# Patient Record
Sex: Female | Born: 1935 | Race: White | Hispanic: No | Marital: Married | Smoking: Never smoker
Health system: Southern US, Community
[De-identification: ages and names within clinical notes are randomized; demographics above are authoritative.]

## PROBLEM LIST (undated history)

## (undated) DIAGNOSIS — C801 Malignant (primary) neoplasm, unspecified: Secondary | ICD-10-CM

## (undated) DIAGNOSIS — R112 Nausea with vomiting, unspecified: Secondary | ICD-10-CM

## (undated) DIAGNOSIS — E039 Hypothyroidism, unspecified: Secondary | ICD-10-CM

## (undated) DIAGNOSIS — E119 Type 2 diabetes mellitus without complications: Secondary | ICD-10-CM

## (undated) DIAGNOSIS — I10 Essential (primary) hypertension: Secondary | ICD-10-CM

## (undated) DIAGNOSIS — R011 Cardiac murmur, unspecified: Secondary | ICD-10-CM

## (undated) HISTORY — PX: CATARACT EXTRACTION: SUR2

## (undated) HISTORY — PX: BACK SURGERY: SHX140

## (undated) HISTORY — DX: Essential (primary) hypertension: I10

## (undated) HISTORY — DX: Type 2 diabetes mellitus without complications: E11.9

---

## 1980-11-24 HISTORY — PX: ANKLE FRACTURE SURGERY: SHX122

## 1999-09-02 ENCOUNTER — Other Ambulatory Visit: Admission: RE | Admit: 1999-09-02 | Discharge: 1999-09-02 | Payer: Self-pay | Admitting: Family Medicine

## 2000-01-30 ENCOUNTER — Encounter: Admission: RE | Admit: 2000-01-30 | Discharge: 2000-01-30 | Payer: Self-pay | Admitting: Internal Medicine

## 2000-01-30 ENCOUNTER — Encounter: Payer: Self-pay | Admitting: Internal Medicine

## 2000-03-12 ENCOUNTER — Observation Stay (HOSPITAL_COMMUNITY): Admission: RE | Admit: 2000-03-12 | Discharge: 2000-03-13 | Payer: Self-pay | Admitting: General Surgery

## 2000-03-12 ENCOUNTER — Encounter (INDEPENDENT_AMBULATORY_CARE_PROVIDER_SITE_OTHER): Payer: Self-pay | Admitting: Specialist

## 2000-10-12 ENCOUNTER — Encounter: Payer: Self-pay | Admitting: Internal Medicine

## 2000-10-12 ENCOUNTER — Encounter: Admission: RE | Admit: 2000-10-12 | Discharge: 2000-10-12 | Payer: Self-pay | Admitting: Internal Medicine

## 2002-09-02 ENCOUNTER — Ambulatory Visit (HOSPITAL_COMMUNITY): Admission: RE | Admit: 2002-09-02 | Discharge: 2002-09-02 | Payer: Self-pay | Admitting: *Deleted

## 2003-04-18 ENCOUNTER — Encounter: Payer: Self-pay | Admitting: Orthopedic Surgery

## 2003-04-20 ENCOUNTER — Encounter: Payer: Self-pay | Admitting: Orthopedic Surgery

## 2003-04-21 ENCOUNTER — Inpatient Hospital Stay (HOSPITAL_COMMUNITY): Admission: RE | Admit: 2003-04-21 | Discharge: 2003-04-22 | Payer: Self-pay | Admitting: Orthopedic Surgery

## 2004-04-11 ENCOUNTER — Encounter: Admission: RE | Admit: 2004-04-11 | Discharge: 2004-04-11 | Payer: Self-pay | Admitting: Internal Medicine

## 2007-05-17 ENCOUNTER — Encounter: Admission: RE | Admit: 2007-05-17 | Discharge: 2007-05-17 | Payer: Self-pay | Admitting: Internal Medicine

## 2008-01-19 ENCOUNTER — Ambulatory Visit (HOSPITAL_COMMUNITY): Admission: RE | Admit: 2008-01-19 | Discharge: 2008-01-20 | Payer: Self-pay | Admitting: Orthopedic Surgery

## 2009-02-19 ENCOUNTER — Encounter: Admission: RE | Admit: 2009-02-19 | Discharge: 2009-02-19 | Payer: Self-pay | Admitting: Internal Medicine

## 2009-02-23 ENCOUNTER — Encounter: Admission: RE | Admit: 2009-02-23 | Discharge: 2009-02-23 | Payer: Self-pay | Admitting: Internal Medicine

## 2011-04-08 NOTE — Op Note (Signed)
Amber [name], Amber [name]                 ACCOUNT NO.:  [id number]   MEDICAL RECORD NO.:  [id number]          PATIENT TYPE:  OIB   LOCATION:  0098                         FACILITY:  [facility]   PHYSICIAN:  Georges Lynch. Gioffre, M.D.DATE OF BIRTH:  1933/12/11   DATE OF PROCEDURE:  01/19/2008  DATE OF DISCHARGE:                               OPERATIVE REPORT   SURGEON:  Georges Lynch. Darrelyn Hillock, M.D.   ASSISTANT:  Jene Every, M.D.   PREOPERATIVE DIAGNOSES:  1. Severe lateral recess stenosis.  2. 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   POSTOPERATIVE DIAGNOSES:  1. Severe lateral recess stenosis.  2. 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   OPERATION:  1. Decompression at L5-S1 on the right for severe spinal stenosis.  2. Microdiskectomy for RECURRENT herniated disk at L5-S1 on the right.   PROCEDURE:  Under general anesthesia routine orthopedic prepping and  draping was carried out with the patient on a spinal frame.  The patient  had 1 gram of IV Ancef preop.  At this time 2 needles were placed in  back for localization purposes, and an x-ray was taken.  Following that,  an incision was made through the old incision site directly over L5-S1.  Bleeders were identified and cauterized.  I then separated the muscle  from the lamina and spinous process at L5-S1 on the right.  The self-  retaining retractors were inserted.  Following that we went down and  easily identified the L5-S1 interspace.   She had a marked amount of scar that I removed first from the dura, and  then we went out and did a nice foraminotomy for the S1 root on the  right.  We had to go far out on the lateral recess, decompressed the  lateral recess because of the severe scarring and overgrowth of bone.  Following that, we identified the S1 root.  We gently dissected the root  free, and then gently retracted the root medially, and went down and  noted large fragments of recurrent disk.  He had  several large fragments  that we removed.  Following that the root now was free.  The foramina  was wide open, and there were no other abnormalities.  The root, now,  was freely movable as well as the dura.  We thoroughly irrigated out the  area, loosely applied some thrombin-soaked Gelfoam.  Following that, the  wound was closed in layers in the usual fashion.  I did leave a small  section of the deep distal part of the wound open for drainage purposes.  Subcu was closed with #0 Vicryl, skin with metal staples, and a sterile  Neosporin dressing was applied.          ______________________________  Georges Lynch Darrelyn Hillock, M.D.    RAG/MEDQ  D:  01/19/2008  T:  01/20/2008  Job:  [id number]

## 2011-04-11 NOTE — Op Note (Signed)
   NAME:  Amber [name], Amber [name]                           ACCOUNT NO.:  [id number]   MEDICAL RECORD NO.:  [id number]                   PATIENT TYPE:  AMB   LOCATION:  DAY                                  FACILITY:  [facility]   PHYSICIAN:  Ronald [name]. Darrelyn Hillock, M.D.             DATE OF BIRTH:  08/06/1934   DATE OF PROCEDURE:  04/20/2003  DATE OF DISCHARGE:                                 OPERATIVE REPORT   SURGEON:  Georges Lynch. Darrelyn Hillock, M.D.   ASSISTANT:  Ronnell Guadalajara, M.D.   PREOPERATIVE DIAGNOSIS:  Recurrent herniated lumbar disk at L5-S1 on the  right.   POSTOPERATIVE DIAGNOSIS:  Recurrent herniated lumbar disk at L5-S1 on the  right.   OPERATION/PROCEDURE:  Hemilaminectomy and microdiskectomy at L5-S1 on the  right for recurrent herniated disk.   DESCRIPTION OF PROCEDURE:  Under general anesthesia, she was placed on the  spinal frame and given Versed and 1 g IV Ancef.  Two needles were placed in  the back for localizing purposes.  X-ray was taken to verify the L5-S1  interspace.  At this time decision was made to open the L5-S1 interspace.  Bleeders were identified and cauterized.  Self-retaining retractors were  inserted.  The incision was carried down to the lamina and the muscle was  separated from the lamina with the Cobb elevators and then with the curets.  Once I identified the lamina of L5-S1 region, I then did [name] hemilaminectomy  in the usual fashion, identified the scar tissue, removed that.  We  cauterized the lateral recessed veins with the bipolar.  Following that, I  went down and identified the dura and the nerve root and then noticed  several large fragments of disk material that were extruded out into the  canal and the lateral recess.  These were gently removed with the nerve  hook.  We then went down into the disk space after making [name] cruciate  incision in the disk space and then completed the diskectomy.  We thoroughly  traced the root out through the foramen and did [name]  foraminotomy as well.  There were no other gross abnormalities noted.  We thoroughly irrigated out  the wound.  I then loosely applied some thrombin-soaked Gelfoam and closed  the wound in layers in the usual fashion.  Sterile Neosporin dressings were  applied.                                                Ronald [name]. Darrelyn Hillock, M.D.    RAG/MEDQ  D:  04/20/2003  T:  04/20/2003  Job:  [id number]

## 2011-08-15 LAB — DIFFERENTIAL
Basophils Relative: 1
Lymphs Abs: 1.4
Monocytes Absolute: 0.6
Monocytes Relative: 6
Neutro Abs: 7.9 — ABNORMAL HIGH
Neutrophils Relative %: 78 — ABNORMAL HIGH

## 2011-08-15 LAB — CBC
HCT: 43.5
MCHC: 35.2
Platelets: 308
RDW: 12.7

## 2011-08-15 LAB — COMPREHENSIVE METABOLIC PANEL
Albumin: 3.8
Alkaline Phosphatase: 76
BUN: 19
Calcium: 9.3
Glucose, Bld: 172 — ABNORMAL HIGH
Potassium: 3.9
Sodium: 133 — ABNORMAL LOW
Total Protein: 6.9

## 2011-08-15 LAB — APTT: aPTT: 29

## 2011-08-15 LAB — PROTIME-INR: INR: 0.9

## 2014-11-22 ENCOUNTER — Other Ambulatory Visit: Payer: Self-pay | Admitting: Internal Medicine

## 2014-11-22 DIAGNOSIS — R51 Headache: Principal | ICD-10-CM

## 2014-11-22 DIAGNOSIS — H538 Other visual disturbances: Secondary | ICD-10-CM

## 2014-11-22 DIAGNOSIS — R519 Headache, unspecified: Secondary | ICD-10-CM

## 2014-12-01 ENCOUNTER — Other Ambulatory Visit: Payer: Self-pay

## 2014-12-22 ENCOUNTER — Other Ambulatory Visit: Payer: Self-pay

## 2014-12-25 ENCOUNTER — Other Ambulatory Visit: Payer: Self-pay

## 2015-01-02 ENCOUNTER — Other Ambulatory Visit: Payer: Self-pay | Admitting: Internal Medicine

## 2015-01-02 ENCOUNTER — Ambulatory Visit
Admission: RE | Admit: 2015-01-02 | Discharge: 2015-01-02 | Disposition: A | Discharge status: Home / Self Care | Payer: Medicare HMO | Source: Ambulatory Visit | Attending: Internal Medicine | Admitting: Internal Medicine

## 2015-01-02 DIAGNOSIS — R059 Cough, unspecified: Secondary | ICD-10-CM

## 2015-01-02 DIAGNOSIS — R05 Cough: Secondary | ICD-10-CM

## 2015-01-05 ENCOUNTER — Ambulatory Visit
Admission: RE | Admit: 2015-01-05 | Discharge: 2015-01-05 | Disposition: A | Discharge status: Home / Self Care | Payer: Medicare HMO | Source: Ambulatory Visit | Attending: Internal Medicine | Admitting: Internal Medicine

## 2015-01-05 DIAGNOSIS — R519 Headache, unspecified: Secondary | ICD-10-CM

## 2015-01-05 DIAGNOSIS — H538 Other visual disturbances: Secondary | ICD-10-CM

## 2015-01-05 DIAGNOSIS — R51 Headache: Principal | ICD-10-CM

## 2015-01-05 MED ORDER — GADOBENATE DIMEGLUMINE 529 MG/ML IV SOLN
14.0000 mL | Freq: Once | INTRAVENOUS | Status: AC | PRN
  Administered 2015-01-05: 14 mL via INTRAVENOUS

## 2018-04-01 DIAGNOSIS — M79644 Pain in right finger(s): Secondary | ICD-10-CM | POA: Insufficient documentation

## 2018-04-01 DIAGNOSIS — M1811 Unilateral primary osteoarthritis of first carpometacarpal joint, right hand: Secondary | ICD-10-CM | POA: Insufficient documentation

## 2019-02-09 ENCOUNTER — Ambulatory Visit
Admission: RE | Admit: 2019-02-09 | Discharge: 2019-02-09 | Disposition: A | Discharge status: Home / Self Care | Payer: Medicare HMO | Source: Ambulatory Visit | Attending: Internal Medicine | Admitting: Internal Medicine

## 2019-02-09 ENCOUNTER — Other Ambulatory Visit: Payer: Self-pay | Admitting: Internal Medicine

## 2019-02-09 DIAGNOSIS — M25571 Pain in right ankle and joints of right foot: Secondary | ICD-10-CM

## 2019-02-11 ENCOUNTER — Other Ambulatory Visit: Payer: Self-pay

## 2019-02-11 ENCOUNTER — Encounter: Payer: Self-pay | Admitting: Sports Medicine

## 2019-02-11 ENCOUNTER — Telehealth: Payer: Self-pay | Admitting: *Deleted

## 2019-02-11 ENCOUNTER — Ambulatory Visit: Payer: Medicare HMO | Admitting: Sports Medicine

## 2019-02-11 VITALS — BP 133/74 | HR 78 | Temp 98.0°F | Resp 16 | Ht 63.0 in | Wt 142.0 lb

## 2019-02-11 DIAGNOSIS — M19071 Primary osteoarthritis, right ankle and foot: Secondary | ICD-10-CM | POA: Diagnosis not present

## 2019-02-11 DIAGNOSIS — M7751 Other enthesopathy of right foot: Secondary | ICD-10-CM

## 2019-02-11 DIAGNOSIS — M25571 Pain in right ankle and joints of right foot: Secondary | ICD-10-CM

## 2019-02-11 MED ORDER — TRIAMCINOLONE ACETONIDE 40 MG/ML IJ SUSP
20.0000 mg | Freq: Once | INTRAMUSCULAR | Status: AC
  Administered 2019-02-11: 20 mg

## 2019-02-11 MED ORDER — DICLOFENAC SODIUM 75 MG PO TBEC: 75.0000 mg | DELAYED_RELEASE_TABLET | Freq: Two times a day (BID) | ORAL | 0 refills | Status: DC

## 2019-02-11 NOTE — Progress Notes (Signed)
Subjective:  Amber [name] is a 85 y.o. female patient who presents to office for evaluation of right ankle pain. Patient complains of continued pain in the ankle every since she broke it in 1982 but since January it has been hurting worse 8 out of 10 sharp pain worse with first steps out of bed in the morning with significant stiffness states that she was trying to be very active with working out however had to stop due to the pain.  Patient tried no treatment for this besides discussing it with her PCP who ordered an x-ray. Patient denies any other pedal complaints. Denies injury/trip/fall/sprain/any causative factors.   Review of Systems  Musculoskeletal: Positive for joint pain and myalgias.  All other systems reviewed and are negative.   Patient Active Problem List   Diagnosis Date Noted  . Pain in finger of right hand 04/01/2018  . Primary osteoarthritis of first carpometacarpal joint of right hand 04/01/2018    Current Outpatient Medications on File Prior to Visit  Medication Sig Dispense Refill  . levothyroxine (SYNTHROID, LEVOTHROID) 50 MCG tablet Take 50 mcg by mouth daily before breakfast.    . metFORMIN (GLUCOPHAGE) 500 MG tablet Take by mouth 2 (two) times daily with a meal.     No current facility-administered medications on file prior to visit.     Not on File  Objective:  General: Alert and oriented x3 in no acute distress  Dermatology: No open lesions bilateral lower extremities, no webspace macerations, no ecchymosis bilateral, all nails x 10 are well manicured.  Vascular: Dorsalis Pedis and Posterior Tibial pedal pulses palpable, Capillary Fill Time 3 seconds,(+) pedal hair growth bilateral, no edema bilateral lower extremities, Temperature gradient within normal limits.  Neurology: Gross sensation intact via light touch bilateral.  Musculoskeletal: Mild tenderness with palpation at right dorsal medial ankle. Negative talar tilt, Negative tib-fib stress, No  instability. No pain with calf compression bilateral. Range of motion within normal limits with mild guarding on right ankle. Strength within normal limits in all groups bilateral.   Gait: Antalgic gait  Assessment and Plan: Problem List Items Addressed This Visit    None    Visit Diagnoses    Arthritis of right ankle    -  Primary   Relevant Medications   diclofenac (VOLTAREN) 75 MG EC tablet   triamcinolone acetonide (KENALOG-40) injection 20 mg (Completed) (Start on 02/11/2019  6:15 PM)   Capsulitis of ankle, right       Relevant Medications   diclofenac (VOLTAREN) 75 MG EC tablet   triamcinolone acetonide (KENALOG-40) injection 20 mg (Completed) (Start on 02/11/2019  6:15 PM)   Acute right ankle pain           -Complete examination performed -Xrays reviewed as in chart on 02/09/2019 IMPRESSION: No acute bony abnormality.  Degenerative changes in the right ankle. 16 mm linear radiopaque foreign body within the soft tissues between the 1st and 2nd metatarsals, possibly needle fragment. -Discussed treatement options for right ankle arthritis and I personally reviewed her x-rays and the foreign body appears to be old in nature and not an area of symptoms -After oral consent and aseptic prep, injected a mixture containing 1 ml of 2%  plain lidocaine, 1 ml 0.5% plain marcaine, 0.5 ml of kenalog 10 and 0.5 ml of dexamethasone phosphate into right dorsal medial ankle without complication. Post-injection care discussed with patient.  -Rx diclofenac to take for the next 2 weeks -Recommend gentle stretching and good supportive shoes  and icing as needed -Patient to return to office as needed or sooner if condition worsens.  Landis Martins, DPM

## 2019-02-11 NOTE — Telephone Encounter (Signed)
Received notice from [facility], stating pt did not have MRI at their facility. I reviewed Chart Review - Imaging and she had a MRI of the Brain 2016.

## 2019-02-11 NOTE — Telephone Encounter (Signed)
Faxed request to [facility] - Records.

## 2019-02-11 NOTE — Progress Notes (Signed)
   Subjective:    Patient ID: Amber [name], female    DOB: 1933/12/08, 85 y.o.   MRN: [id number]  HPI    Review of Systems  Musculoskeletal: Positive for arthralgias and myalgias.  All other systems reviewed and are negative.      Objective:   Physical Exam        Assessment & Plan:

## 2019-02-11 NOTE — Telephone Encounter (Signed)
She brought in a report for foot xray that said [facility] at the top

## 2019-02-11 NOTE — Telephone Encounter (Signed)
-----   Message from Landis Martins, [place] sent at 02/11/2019  8:17 AM EDT ----- Regarding: Request CD [facility] Patient had Xray R ankle this month at [facility] Please contact them to send CD for me to review Thanks Dr Chauncey Cruel

## 2019-05-31 IMAGING — CR RIGHT ANKLE - COMPLETE 3+ VIEW
3 series · 3 of 3 positions shown · non-contrast
Comparison: None.

CLINICAL DATA: Right ankle injury, pain

EXAM:
RIGHT ANKLE - COMPLETE 3+ VIEW

[t ankle joint ap right]
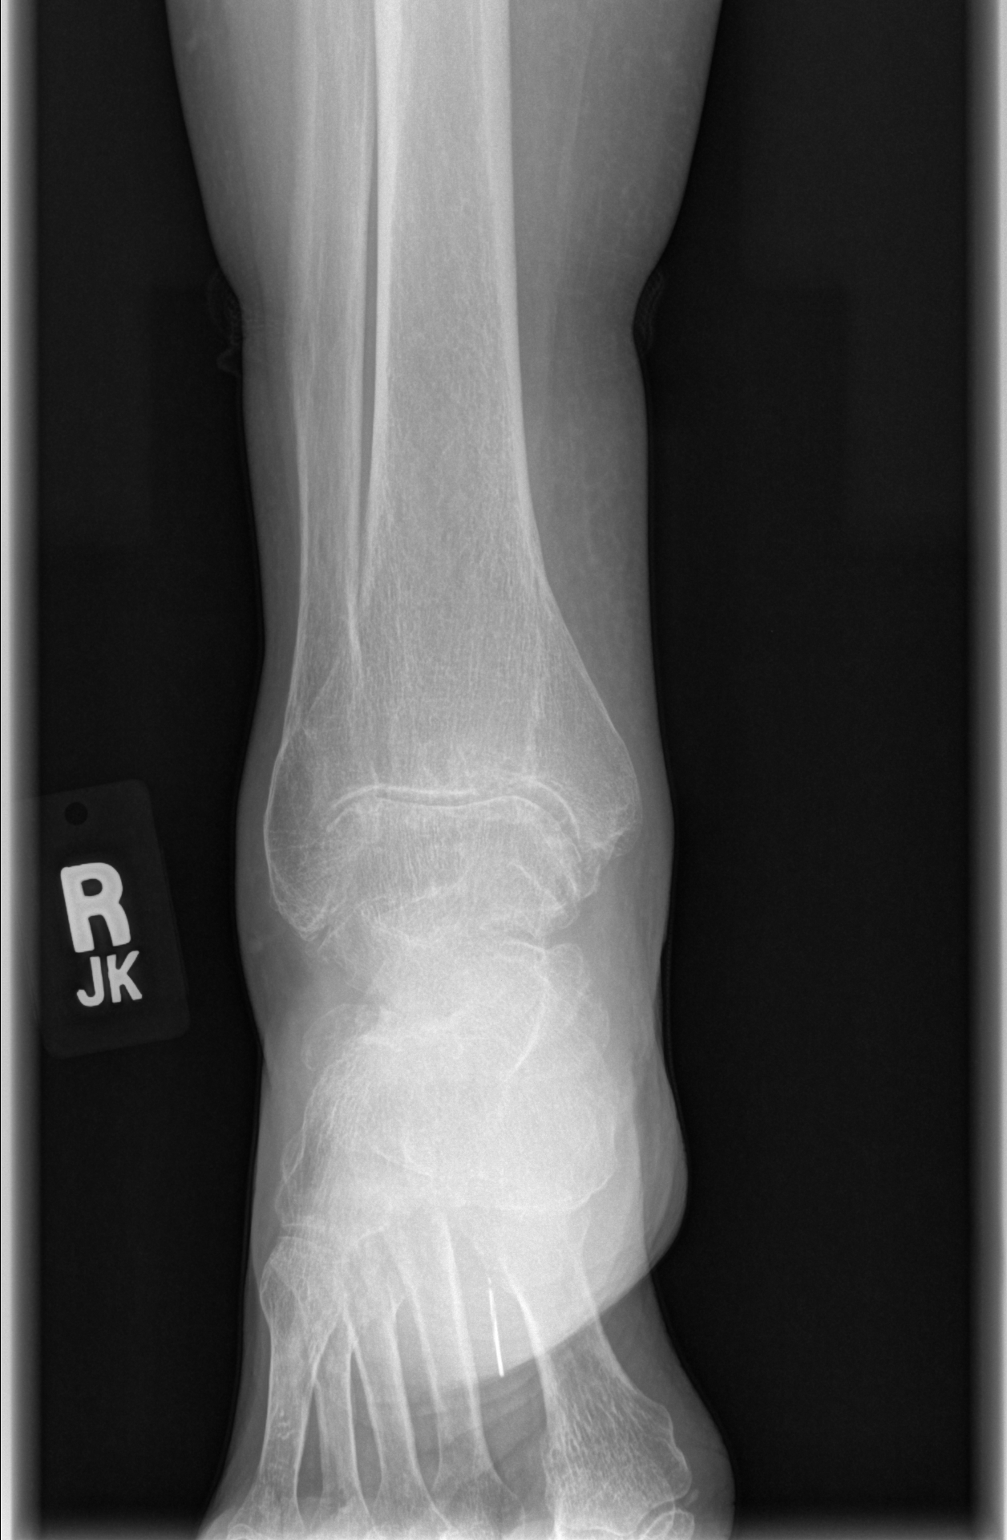

[t ankle joint oblique right]
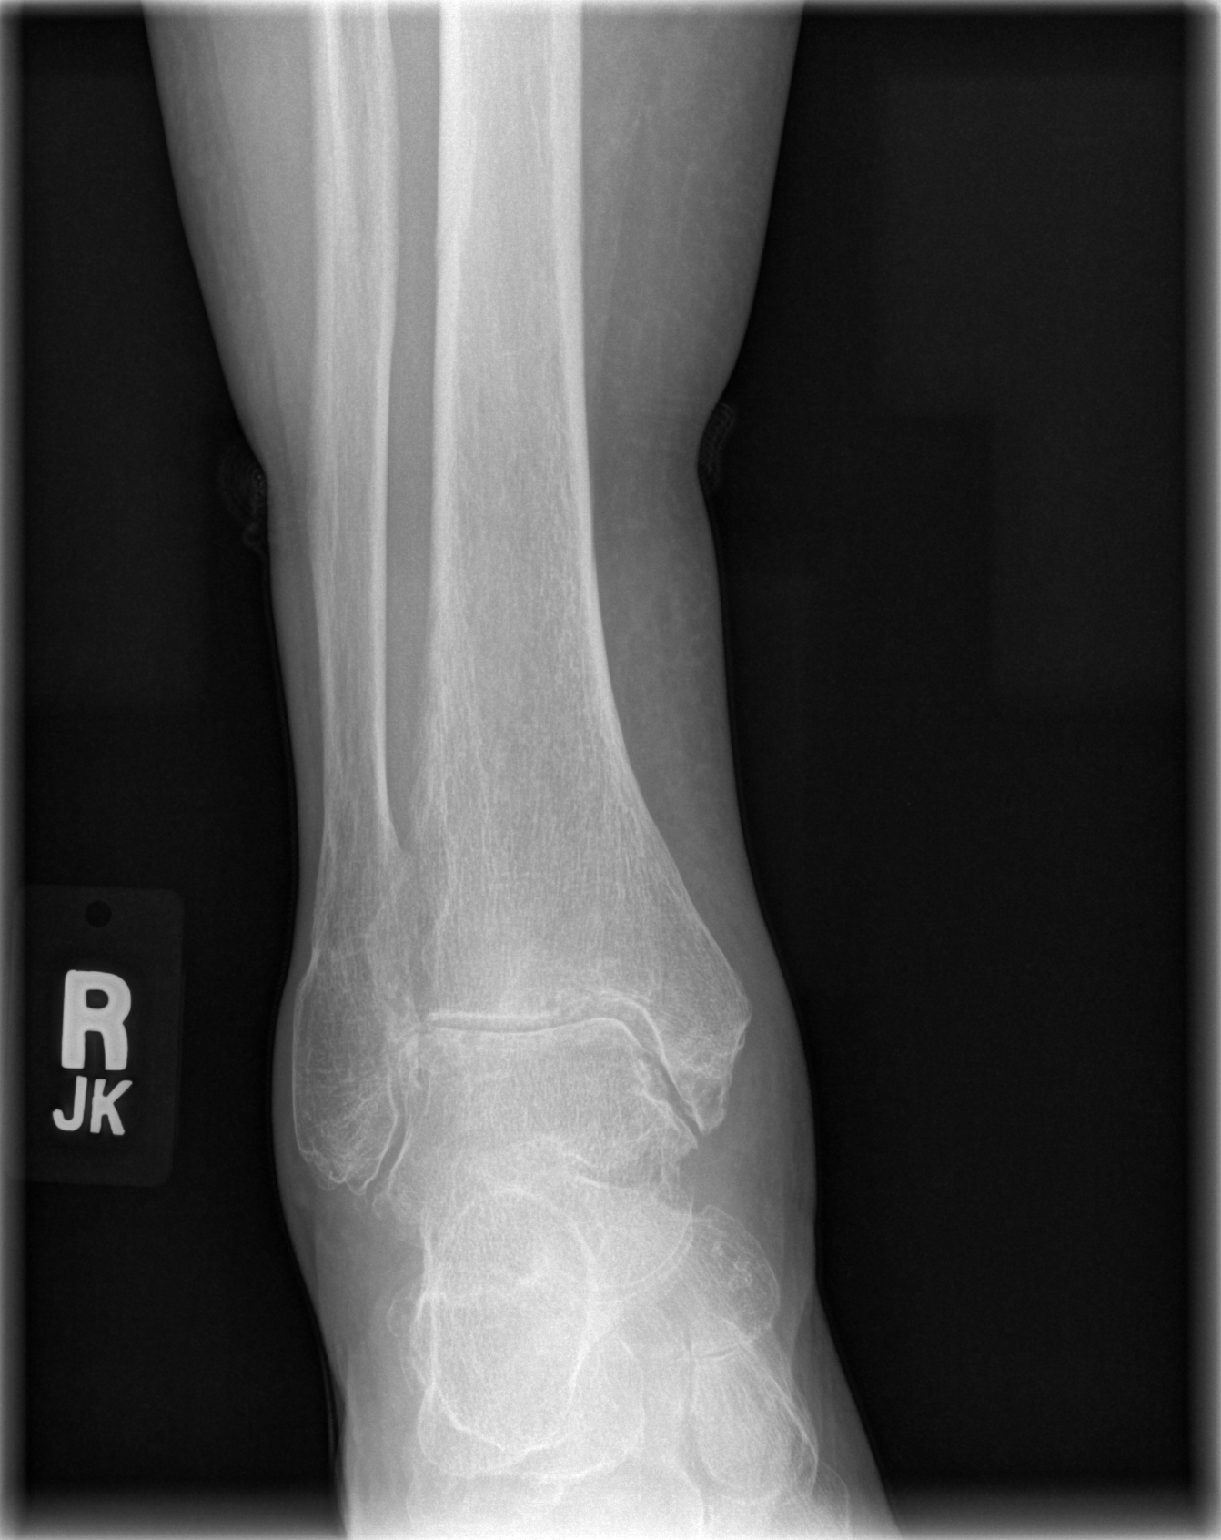

[t ankle joint lat right]
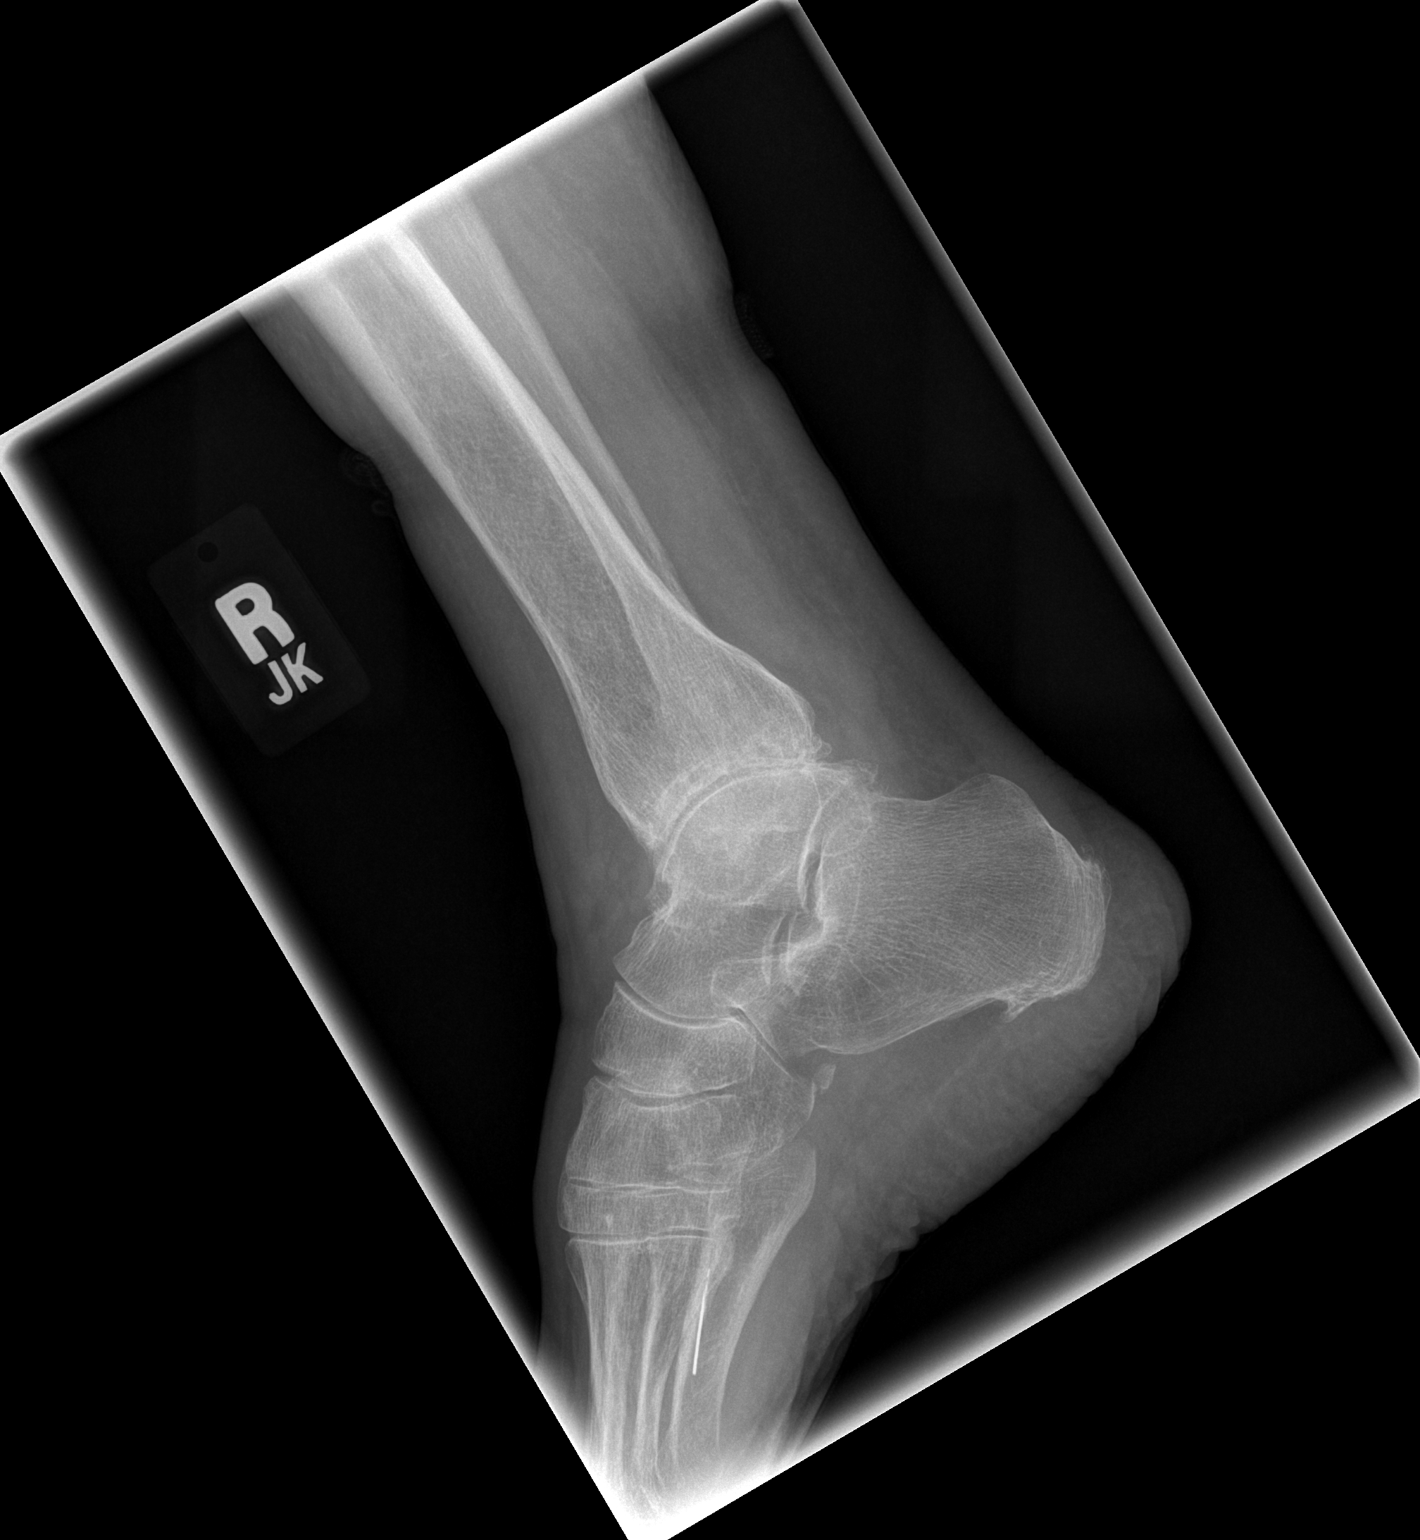

[3 of 3 positions shown; findings below may reference images not displayed]

FINDINGS: Degenerative changes in the right ankle with joint space narrowing
and spurring. No fracture, subluxation or dislocation. Linear
radiopaque foreign body noted in the soft tissues between the 1st
and 2nd metatarsals measuring 16 mm in length.
IMPRESSION: No acute bony abnormality.  Degenerative changes in the right ankle.

16 mm linear radiopaque foreign body within the soft tissues between
the 1st and 2nd metatarsals, possibly needle fragment.

## 2019-07-21 ENCOUNTER — Ambulatory Visit (INDEPENDENT_AMBULATORY_CARE_PROVIDER_SITE_OTHER): Payer: Medicare HMO | Admitting: Sports Medicine

## 2019-07-21 ENCOUNTER — Other Ambulatory Visit: Payer: Self-pay

## 2019-07-21 ENCOUNTER — Encounter: Payer: Self-pay | Admitting: Sports Medicine

## 2019-07-21 VITALS — Temp 97.9°F | Resp 16

## 2019-07-21 DIAGNOSIS — M7751 Other enthesopathy of right foot: Secondary | ICD-10-CM | POA: Diagnosis not present

## 2019-07-21 DIAGNOSIS — M19071 Primary osteoarthritis, right ankle and foot: Secondary | ICD-10-CM | POA: Diagnosis not present

## 2019-07-21 DIAGNOSIS — M25571 Pain in right ankle and joints of right foot: Secondary | ICD-10-CM

## 2019-07-21 MED ORDER — TRIAMCINOLONE ACETONIDE 10 MG/ML IJ SUSP
10.0000 mg | Freq: Once | INTRAMUSCULAR | Status: AC
  Administered 2019-07-21: 12:00:00 10 mg

## 2019-07-21 NOTE — Progress Notes (Addendum)
Subjective:  Amber [name] is a 85 y.o. female patient who returns to office for follow-up evaluation of right ankle pain.  Patient reports that the last shot helped tremendously and over the last 2 weeks the pain is slowly came back pain is now 9-10 out of 10 has tried over-the-counter pain creams with no improvements admits a little bit of swelling but denies redness warmth drainage nausea vomiting fever chills or any other constitutional symptoms at this time.  Patient Active Problem List   Diagnosis Date Noted  . Pain in finger of right hand 04/01/2018  . Primary osteoarthritis of first carpometacarpal joint of right hand 04/01/2018    Current Outpatient Medications on File Prior to Visit  Medication Sig Dispense Refill  . diclofenac (VOLTAREN) 75 MG EC tablet Take 1 tablet (75 mg total) by mouth 2 (two) times daily. 30 tablet 0  . hydrochlorothiazide (HYDRODIURIL) 12.5 MG tablet TAKE 1 TABLET BY MOUTH ONCE DAILY FOR BLOOD PRESSURE    . latanoprost (XALATAN) 0.005 % ophthalmic solution INSTILL 1 DROP INTO BOTH EYES EVERY DAY AT BEDTIME    . levothyroxine (SYNTHROID, LEVOTHROID) 50 MCG tablet Take 50 mcg by mouth daily before breakfast.    . lisinopril (PRINIVIL,ZESTRIL) 20 MG tablet Take 20 mg by mouth daily.    Marland Kitchen lisinopril (ZESTRIL) 40 MG tablet Take 40 mg by mouth daily.    . metFORMIN (GLUCOPHAGE) 500 MG tablet Take by mouth 2 (two) times daily with a meal.     No current facility-administered medications on file prior to visit.     Not on File  Objective:  General: Alert and oriented x3 in no acute distress  Dermatology: No open lesions bilateral lower extremities, no webspace macerations, no ecchymosis bilateral, all nails x 10 are well manicured.  Vascular: Dorsalis Pedis and Posterior Tibial pedal pulses palpable, Capillary Fill Time 3 seconds,(+) pedal hair growth bilateral, no edema bilateral lower extremities, Temperature gradient within normal limits.  Neurology: Gross  sensation intact via light touch bilateral.  Musculoskeletal: Mild to moderate tenderness with palpation at right dorsal medial ankle. Negative talar tilt, Negative tib-fib stress, No instability. No pain with calf compression bilateral. Range of motion within normal limits with mild guarding on right ankle. Strength within normal limits in all groups bilateral.   Gait: Antalgic gait  Assessment and Plan: Problem List Items Addressed This Visit    None    Visit Diagnoses    Capsulitis of ankle, right    -  Primary   Relevant Medications   triamcinolone acetonide (KENALOG) 10 MG/ML injection 10 mg (Completed) (Start on 07/21/2019 12:30 PM)   Arthritis of right ankle       Relevant Medications   triamcinolone acetonide (KENALOG) 10 MG/ML injection 10 mg (Completed) (Start on 07/21/2019 12:30 PM)   Acute right ankle pain           -Complete examination performed -Patient declined a new set of x-rays at this visit -We discussed the nature of capsulitis and traumatic arthritis history -After oral consent and aseptic prep, injected a mixture containing 1 ml of 2%  plain lidocaine, 1 ml 0.5% plain marcaine, 0.5 ml of kenalog 10 and 0.5 ml of dexamethasone phosphate into right dorsal medial ankle without complication. Post-injection care discussed with patient. This is injection #2 to the area.  -Advised patient if pain continues to resume taking her diclofenac which she has -Recommend gentle stretching and good supportive shoes and icing as needed -Recommend ankle support  especially when trying to be active with walking or exercise -Patient to return to office as needed or sooner if condition worsens.  Landis Martins, DPM

## 2020-03-26 ENCOUNTER — Ambulatory Visit
Admission: RE | Admit: 2020-03-26 | Discharge: 2020-03-26 | Disposition: A | Discharge status: Home / Self Care | Payer: Medicare HMO | Source: Ambulatory Visit | Attending: Internal Medicine | Admitting: Internal Medicine

## 2020-03-26 ENCOUNTER — Other Ambulatory Visit: Payer: Self-pay | Admitting: Internal Medicine

## 2020-03-26 DIAGNOSIS — R0602 Shortness of breath: Secondary | ICD-10-CM

## 2020-05-09 ENCOUNTER — Other Ambulatory Visit: Payer: Self-pay | Admitting: Orthopedic Surgery

## 2020-05-09 DIAGNOSIS — M546 Pain in thoracic spine: Secondary | ICD-10-CM

## 2020-05-25 ENCOUNTER — Other Ambulatory Visit: Payer: Self-pay

## 2020-05-25 ENCOUNTER — Ambulatory Visit
Admission: RE | Admit: 2020-05-25 | Discharge: 2020-05-25 | Disposition: A | Discharge status: Home / Self Care | Payer: Medicare HMO | Source: Ambulatory Visit | Attending: Orthopedic Surgery | Admitting: Orthopedic Surgery

## 2020-05-25 DIAGNOSIS — M546 Pain in thoracic spine: Secondary | ICD-10-CM

## 2020-06-05 ENCOUNTER — Other Ambulatory Visit: Payer: Self-pay | Admitting: Orthopedic Surgery

## 2020-06-05 DIAGNOSIS — E041 Nontoxic single thyroid nodule: Secondary | ICD-10-CM

## 2020-06-19 ENCOUNTER — Ambulatory Visit
Admission: RE | Admit: 2020-06-19 | Discharge: 2020-06-19 | Disposition: A | Discharge status: Home / Self Care | Payer: Medicare HMO | Source: Ambulatory Visit | Attending: Orthopedic Surgery | Admitting: Orthopedic Surgery

## 2020-06-19 DIAGNOSIS — E041 Nontoxic single thyroid nodule: Secondary | ICD-10-CM

## 2020-10-08 IMAGING — US US THYROID
1 series · 13 of 25 positions shown · non-contrast
Comparison: 05/25/2020

CLINICAL DATA: Thyroid nodules by CT

EXAM:
THYROID ULTRASOUND
TECHNIQUE: Ultrasound examination of the thyroid gland and adjacent soft
tissues was performed.

[Series 1: us thyroid · 0.06mm/px · 13 of 50 slices shown]
[im 1/50]
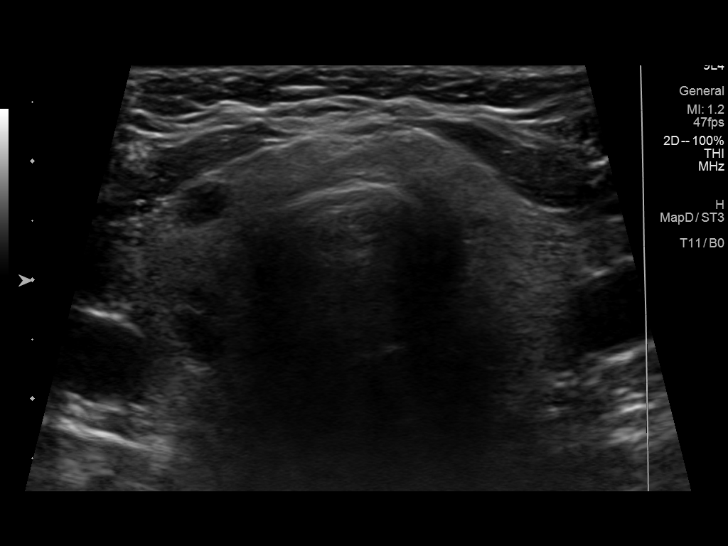
[im 5/50]
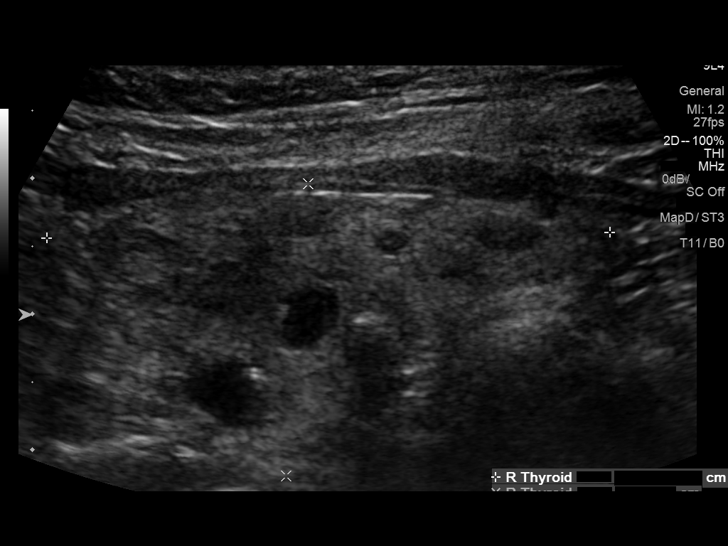
[im 9/50]
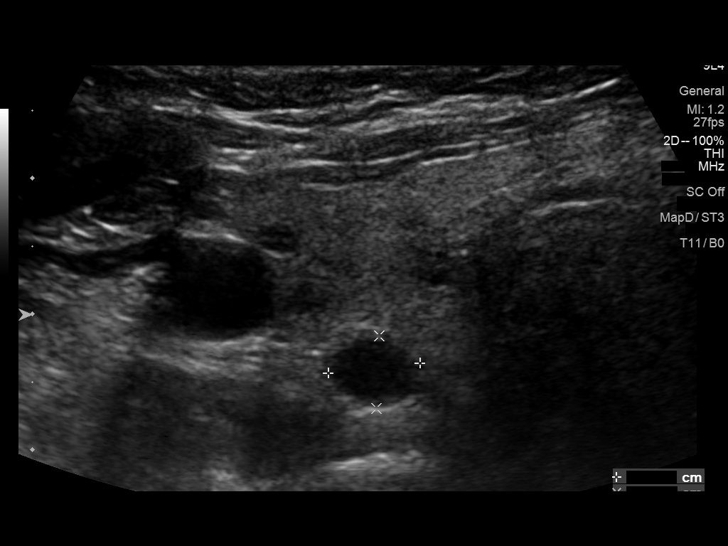
[im 13/50]
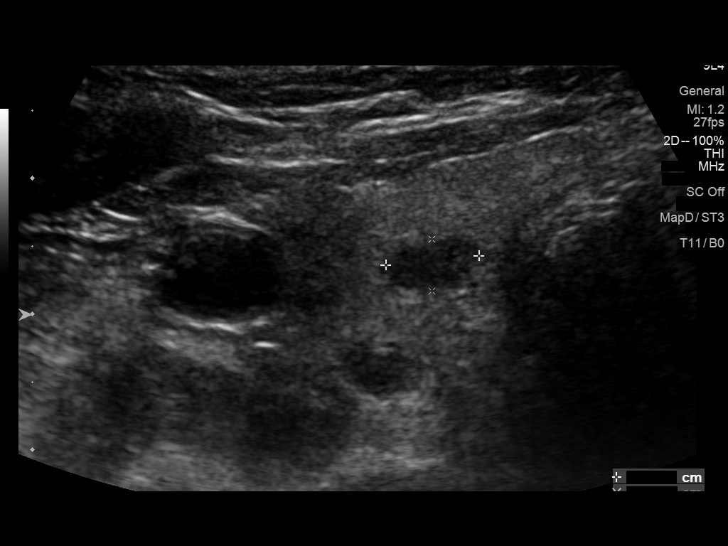
[im 17/50]
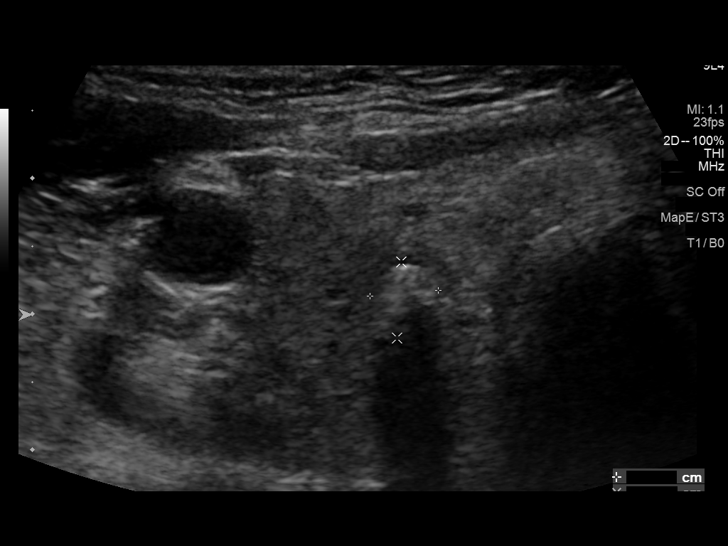
[im 21/50]
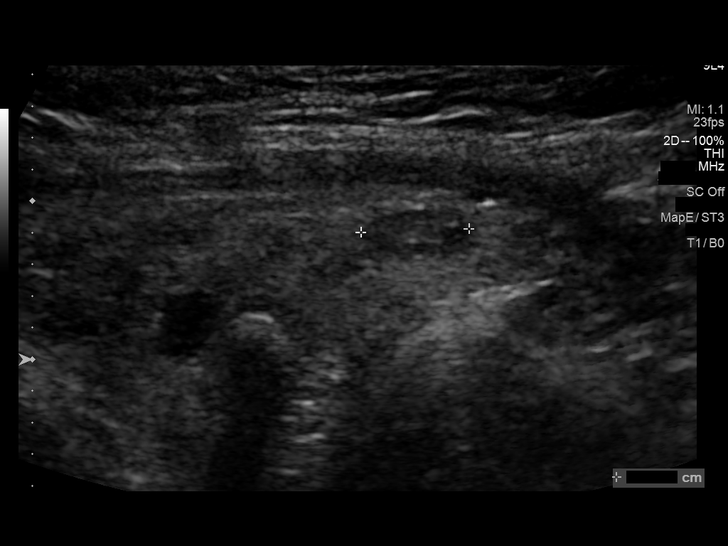
[im 25/50]
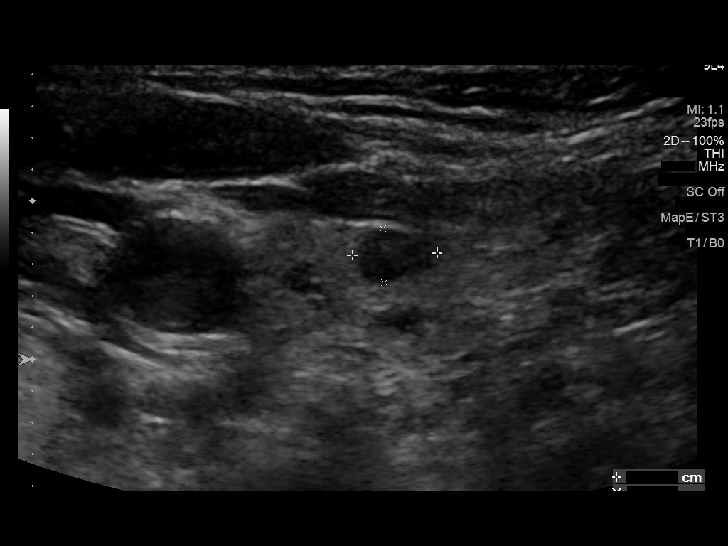
[im 29/50]
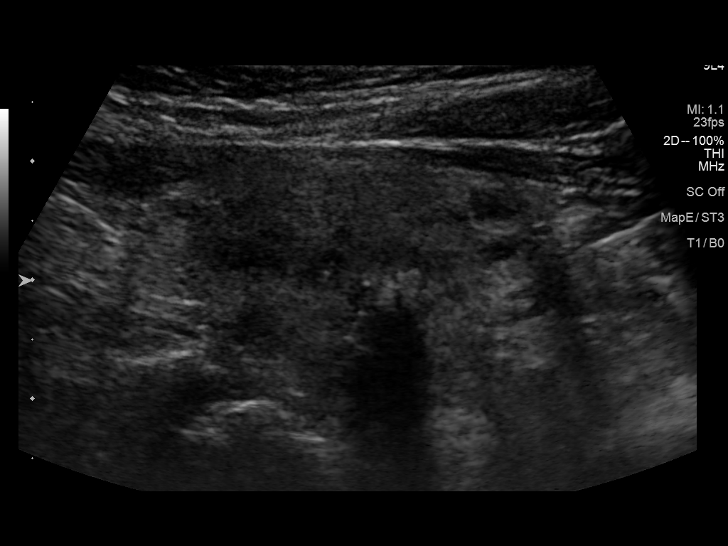
[im 33/50]
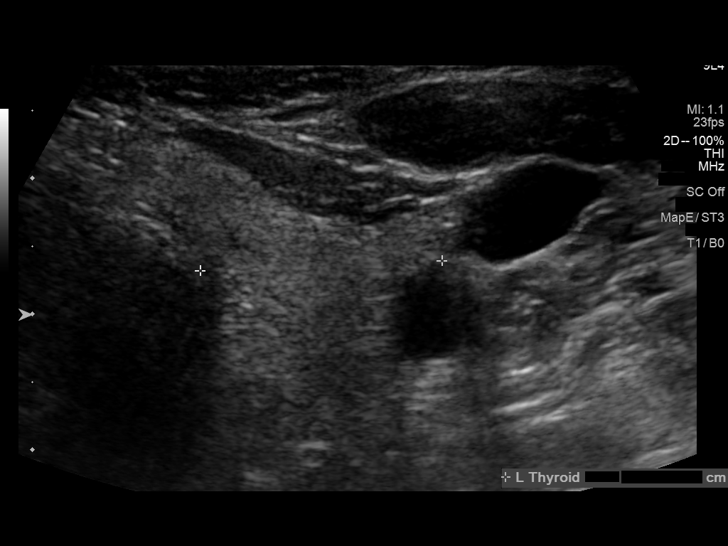
[im 37/50]
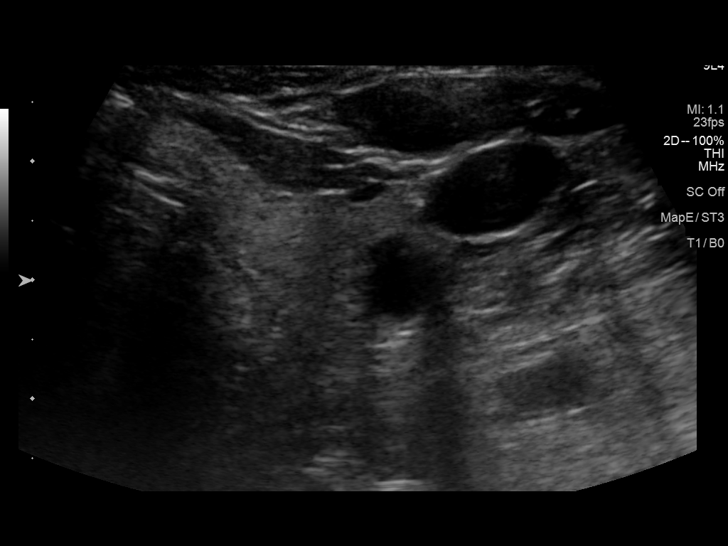
[im 41/50]
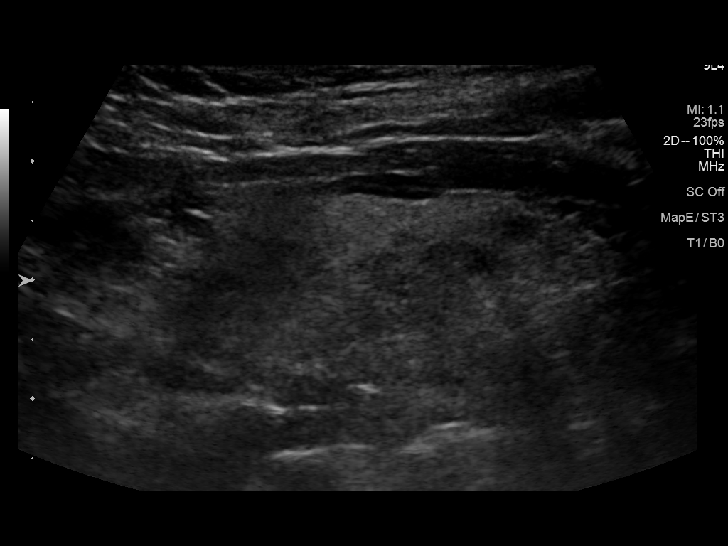
[im 45/50]
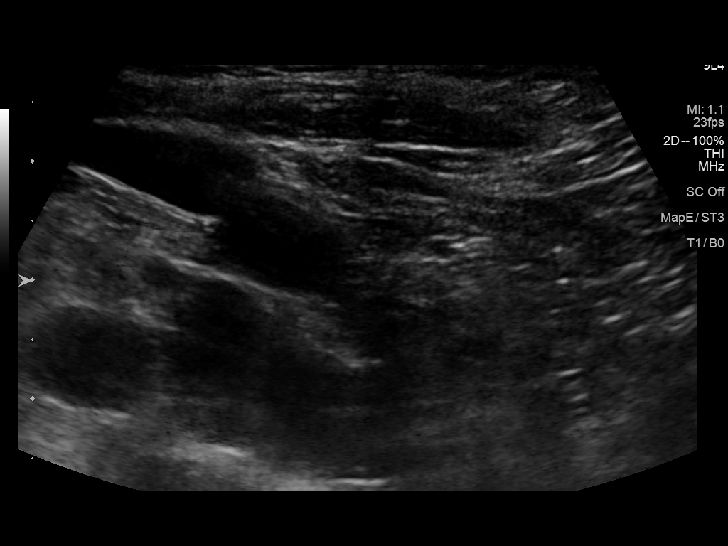
[im 50/50]
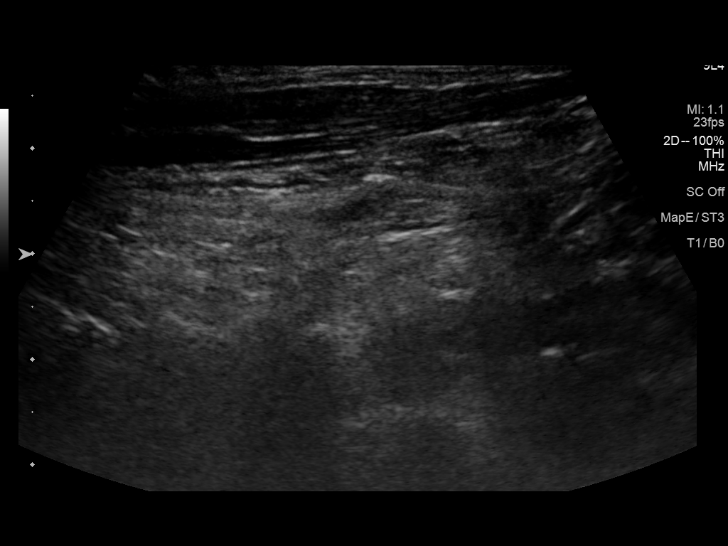

[13 of 25 positions shown; findings below may reference images not displayed]

FINDINGS: Parenchymal Echotexture: Moderately heterogenous

Isthmus: 6 mm

Right lobe: 4.2 x 2.3 x 1.8 cm

Left lobe: 3.8 x 1.4 x 1.8 cm

_________________________________________________________

Estimated total number of nodules >/= 1 cm: 0

Number of spongiform nodules >/=  2 cm not described below (TR1): 0

Number of mixed cystic and solid nodules >/= 1.5 cm not described
below (TR2): 0

_________________________________________________________

Moderate thyroid heterogeneity. There are several scattered right
mid and lower thyroid subcentimeter cystic and hypoechoic nodules
noted all measuring 8 mm or less in size. There is also a right
inferior thyroid 8 mm dystrophic calcification which correlates with
the CT finding. None of these nodules meet criteria for follow-up or
biopsy.

No hypervascularity or regional adenopathy.
IMPRESSION: Nonspecific thyroid heterogeneity and right thyroid subcentimeter
nodules and dystrophic calcification. These findings correlate with
the CT finding. See above comment.

The above is in keeping with the ACR TI-RADS recommendations - [HOSPITAL] 3856;[DATE].

## 2020-11-26 DIAGNOSIS — I10 Essential (primary) hypertension: Secondary | ICD-10-CM | POA: Diagnosis not present

## 2020-12-20 DIAGNOSIS — H35371 Puckering of macula, right eye: Secondary | ICD-10-CM | POA: Diagnosis not present

## 2020-12-20 DIAGNOSIS — H3540 Unspecified peripheral retinal degeneration: Secondary | ICD-10-CM | POA: Diagnosis not present

## 2020-12-20 DIAGNOSIS — H35341 Macular cyst, hole, or pseudohole, right eye: Secondary | ICD-10-CM | POA: Diagnosis not present

## 2020-12-20 DIAGNOSIS — H43813 Vitreous degeneration, bilateral: Secondary | ICD-10-CM | POA: Diagnosis not present

## 2021-01-11 DIAGNOSIS — G8929 Other chronic pain: Secondary | ICD-10-CM | POA: Diagnosis not present

## 2021-01-11 DIAGNOSIS — E039 Hypothyroidism, unspecified: Secondary | ICD-10-CM | POA: Diagnosis not present

## 2021-01-11 DIAGNOSIS — E1165 Type 2 diabetes mellitus with hyperglycemia: Secondary | ICD-10-CM | POA: Diagnosis not present

## 2021-01-11 DIAGNOSIS — H409 Unspecified glaucoma: Secondary | ICD-10-CM | POA: Diagnosis not present

## 2021-01-11 DIAGNOSIS — E785 Hyperlipidemia, unspecified: Secondary | ICD-10-CM | POA: Diagnosis not present

## 2021-01-11 DIAGNOSIS — Z791 Long term (current) use of non-steroidal anti-inflammatories (NSAID): Secondary | ICD-10-CM | POA: Diagnosis not present

## 2021-01-11 DIAGNOSIS — I10 Essential (primary) hypertension: Secondary | ICD-10-CM | POA: Diagnosis not present

## 2021-01-11 DIAGNOSIS — M1990 Unspecified osteoarthritis, unspecified site: Secondary | ICD-10-CM | POA: Diagnosis not present

## 2021-01-11 DIAGNOSIS — I499 Cardiac arrhythmia, unspecified: Secondary | ICD-10-CM | POA: Diagnosis not present

## 2021-01-11 DIAGNOSIS — M545 Low back pain, unspecified: Secondary | ICD-10-CM | POA: Diagnosis not present

## 2021-01-15 DIAGNOSIS — M546 Pain in thoracic spine: Secondary | ICD-10-CM | POA: Diagnosis not present

## 2021-02-19 DIAGNOSIS — Z961 Presence of intraocular lens: Secondary | ICD-10-CM | POA: Diagnosis not present

## 2021-02-19 DIAGNOSIS — H35341 Macular cyst, hole, or pseudohole, right eye: Secondary | ICD-10-CM | POA: Diagnosis not present

## 2021-02-19 DIAGNOSIS — H524 Presbyopia: Secondary | ICD-10-CM | POA: Diagnosis not present

## 2021-02-19 DIAGNOSIS — H401132 Primary open-angle glaucoma, bilateral, moderate stage: Secondary | ICD-10-CM | POA: Diagnosis not present

## 2021-02-19 DIAGNOSIS — Z7984 Long term (current) use of oral hypoglycemic drugs: Secondary | ICD-10-CM | POA: Diagnosis not present

## 2021-02-19 DIAGNOSIS — H43393 Other vitreous opacities, bilateral: Secondary | ICD-10-CM | POA: Diagnosis not present

## 2021-02-19 DIAGNOSIS — E119 Type 2 diabetes mellitus without complications: Secondary | ICD-10-CM | POA: Diagnosis not present

## 2021-02-26 DIAGNOSIS — M9902 Segmental and somatic dysfunction of thoracic region: Secondary | ICD-10-CM | POA: Diagnosis not present

## 2021-02-26 DIAGNOSIS — M5134 Other intervertebral disc degeneration, thoracic region: Secondary | ICD-10-CM | POA: Diagnosis not present

## 2021-03-05 DIAGNOSIS — M5134 Other intervertebral disc degeneration, thoracic region: Secondary | ICD-10-CM | POA: Diagnosis not present

## 2021-03-05 DIAGNOSIS — M9902 Segmental and somatic dysfunction of thoracic region: Secondary | ICD-10-CM | POA: Diagnosis not present

## 2021-03-11 DIAGNOSIS — Z1331 Encounter for screening for depression: Secondary | ICD-10-CM | POA: Diagnosis not present

## 2021-03-11 DIAGNOSIS — I10 Essential (primary) hypertension: Secondary | ICD-10-CM | POA: Diagnosis not present

## 2021-03-11 DIAGNOSIS — M546 Pain in thoracic spine: Secondary | ICD-10-CM | POA: Diagnosis not present

## 2021-03-11 DIAGNOSIS — R202 Paresthesia of skin: Secondary | ICD-10-CM | POA: Diagnosis not present

## 2021-03-11 DIAGNOSIS — E039 Hypothyroidism, unspecified: Secondary | ICD-10-CM | POA: Diagnosis not present

## 2021-03-11 DIAGNOSIS — E785 Hyperlipidemia, unspecified: Secondary | ICD-10-CM | POA: Diagnosis not present

## 2021-03-11 DIAGNOSIS — R2681 Unsteadiness on feet: Secondary | ICD-10-CM | POA: Diagnosis not present

## 2021-03-11 DIAGNOSIS — E1169 Type 2 diabetes mellitus with other specified complication: Secondary | ICD-10-CM | POA: Diagnosis not present

## 2021-03-11 DIAGNOSIS — I700 Atherosclerosis of aorta: Secondary | ICD-10-CM | POA: Diagnosis not present

## 2021-03-11 DIAGNOSIS — R7989 Other specified abnormal findings of blood chemistry: Secondary | ICD-10-CM | POA: Diagnosis not present

## 2021-03-11 DIAGNOSIS — G629 Polyneuropathy, unspecified: Secondary | ICD-10-CM | POA: Diagnosis not present

## 2021-03-11 DIAGNOSIS — R5383 Other fatigue: Secondary | ICD-10-CM | POA: Diagnosis not present

## 2021-03-12 DIAGNOSIS — M5134 Other intervertebral disc degeneration, thoracic region: Secondary | ICD-10-CM | POA: Diagnosis not present

## 2021-03-12 DIAGNOSIS — M9902 Segmental and somatic dysfunction of thoracic region: Secondary | ICD-10-CM | POA: Diagnosis not present

## 2021-03-19 DIAGNOSIS — M5134 Other intervertebral disc degeneration, thoracic region: Secondary | ICD-10-CM | POA: Diagnosis not present

## 2021-03-19 DIAGNOSIS — M9902 Segmental and somatic dysfunction of thoracic region: Secondary | ICD-10-CM | POA: Diagnosis not present

## 2021-04-02 DIAGNOSIS — M9902 Segmental and somatic dysfunction of thoracic region: Secondary | ICD-10-CM | POA: Diagnosis not present

## 2021-04-02 DIAGNOSIS — M5134 Other intervertebral disc degeneration, thoracic region: Secondary | ICD-10-CM | POA: Diagnosis not present

## 2021-04-16 DIAGNOSIS — M9902 Segmental and somatic dysfunction of thoracic region: Secondary | ICD-10-CM | POA: Diagnosis not present

## 2021-04-16 DIAGNOSIS — M5134 Other intervertebral disc degeneration, thoracic region: Secondary | ICD-10-CM | POA: Diagnosis not present

## 2021-04-23 DIAGNOSIS — M9902 Segmental and somatic dysfunction of thoracic region: Secondary | ICD-10-CM | POA: Diagnosis not present

## 2021-04-23 DIAGNOSIS — M5134 Other intervertebral disc degeneration, thoracic region: Secondary | ICD-10-CM | POA: Diagnosis not present

## 2021-04-30 DIAGNOSIS — M9902 Segmental and somatic dysfunction of thoracic region: Secondary | ICD-10-CM | POA: Diagnosis not present

## 2021-04-30 DIAGNOSIS — M5134 Other intervertebral disc degeneration, thoracic region: Secondary | ICD-10-CM | POA: Diagnosis not present

## 2021-05-14 DIAGNOSIS — M9902 Segmental and somatic dysfunction of thoracic region: Secondary | ICD-10-CM | POA: Diagnosis not present

## 2021-05-14 DIAGNOSIS — M546 Pain in thoracic spine: Secondary | ICD-10-CM | POA: Diagnosis not present

## 2021-05-14 DIAGNOSIS — M5134 Other intervertebral disc degeneration, thoracic region: Secondary | ICD-10-CM | POA: Diagnosis not present

## 2021-06-03 DIAGNOSIS — M546 Pain in thoracic spine: Secondary | ICD-10-CM | POA: Diagnosis not present

## 2021-06-03 DIAGNOSIS — M9902 Segmental and somatic dysfunction of thoracic region: Secondary | ICD-10-CM | POA: Diagnosis not present

## 2021-06-03 DIAGNOSIS — M5134 Other intervertebral disc degeneration, thoracic region: Secondary | ICD-10-CM | POA: Diagnosis not present

## 2021-06-24 DIAGNOSIS — E039 Hypothyroidism, unspecified: Secondary | ICD-10-CM | POA: Diagnosis not present

## 2021-06-24 DIAGNOSIS — I10 Essential (primary) hypertension: Secondary | ICD-10-CM | POA: Diagnosis not present

## 2021-06-24 DIAGNOSIS — E1169 Type 2 diabetes mellitus with other specified complication: Secondary | ICD-10-CM | POA: Diagnosis not present

## 2021-06-24 DIAGNOSIS — Z1331 Encounter for screening for depression: Secondary | ICD-10-CM | POA: Diagnosis not present

## 2021-06-24 DIAGNOSIS — M546 Pain in thoracic spine: Secondary | ICD-10-CM | POA: Diagnosis not present

## 2021-06-24 DIAGNOSIS — R5383 Other fatigue: Secondary | ICD-10-CM | POA: Diagnosis not present

## 2021-06-24 DIAGNOSIS — R2681 Unsteadiness on feet: Secondary | ICD-10-CM | POA: Diagnosis not present

## 2021-06-24 DIAGNOSIS — G629 Polyneuropathy, unspecified: Secondary | ICD-10-CM | POA: Diagnosis not present

## 2021-06-24 DIAGNOSIS — I700 Atherosclerosis of aorta: Secondary | ICD-10-CM | POA: Diagnosis not present

## 2021-06-24 DIAGNOSIS — E785 Hyperlipidemia, unspecified: Secondary | ICD-10-CM | POA: Diagnosis not present

## 2021-07-09 DIAGNOSIS — M546 Pain in thoracic spine: Secondary | ICD-10-CM | POA: Diagnosis not present

## 2021-07-09 DIAGNOSIS — M9902 Segmental and somatic dysfunction of thoracic region: Secondary | ICD-10-CM | POA: Diagnosis not present

## 2021-07-09 DIAGNOSIS — M5134 Other intervertebral disc degeneration, thoracic region: Secondary | ICD-10-CM | POA: Diagnosis not present

## 2021-07-31 DIAGNOSIS — Z961 Presence of intraocular lens: Secondary | ICD-10-CM | POA: Diagnosis not present

## 2021-07-31 DIAGNOSIS — E119 Type 2 diabetes mellitus without complications: Secondary | ICD-10-CM | POA: Diagnosis not present

## 2021-07-31 DIAGNOSIS — Z7984 Long term (current) use of oral hypoglycemic drugs: Secondary | ICD-10-CM | POA: Diagnosis not present

## 2021-07-31 DIAGNOSIS — H524 Presbyopia: Secondary | ICD-10-CM | POA: Diagnosis not present

## 2021-07-31 DIAGNOSIS — H43393 Other vitreous opacities, bilateral: Secondary | ICD-10-CM | POA: Diagnosis not present

## 2021-07-31 DIAGNOSIS — H35341 Macular cyst, hole, or pseudohole, right eye: Secondary | ICD-10-CM | POA: Diagnosis not present

## 2021-07-31 DIAGNOSIS — H401132 Primary open-angle glaucoma, bilateral, moderate stage: Secondary | ICD-10-CM | POA: Diagnosis not present

## 2021-08-28 DIAGNOSIS — R0981 Nasal congestion: Secondary | ICD-10-CM | POA: Diagnosis not present

## 2021-08-28 DIAGNOSIS — R509 Fever, unspecified: Secondary | ICD-10-CM | POA: Diagnosis not present

## 2021-08-28 DIAGNOSIS — R059 Cough, unspecified: Secondary | ICD-10-CM | POA: Diagnosis not present

## 2021-08-28 DIAGNOSIS — Z20828 Contact with and (suspected) exposure to other viral communicable diseases: Secondary | ICD-10-CM | POA: Diagnosis not present

## 2021-09-04 DIAGNOSIS — R051 Acute cough: Secondary | ICD-10-CM | POA: Diagnosis not present

## 2021-09-04 DIAGNOSIS — Z20828 Contact with and (suspected) exposure to other viral communicable diseases: Secondary | ICD-10-CM | POA: Diagnosis not present

## 2021-09-04 DIAGNOSIS — R509 Fever, unspecified: Secondary | ICD-10-CM | POA: Diagnosis not present

## 2021-09-13 DIAGNOSIS — E039 Hypothyroidism, unspecified: Secondary | ICD-10-CM | POA: Diagnosis not present

## 2021-09-13 DIAGNOSIS — E1169 Type 2 diabetes mellitus with other specified complication: Secondary | ICD-10-CM | POA: Diagnosis not present

## 2021-09-13 DIAGNOSIS — E785 Hyperlipidemia, unspecified: Secondary | ICD-10-CM | POA: Diagnosis not present

## 2021-09-13 DIAGNOSIS — I10 Essential (primary) hypertension: Secondary | ICD-10-CM | POA: Diagnosis not present

## 2021-09-20 DIAGNOSIS — G629 Polyneuropathy, unspecified: Secondary | ICD-10-CM | POA: Diagnosis not present

## 2021-09-20 DIAGNOSIS — M9901 Segmental and somatic dysfunction of cervical region: Secondary | ICD-10-CM | POA: Diagnosis not present

## 2021-09-20 DIAGNOSIS — M9908 Segmental and somatic dysfunction of rib cage: Secondary | ICD-10-CM | POA: Diagnosis not present

## 2021-09-20 DIAGNOSIS — D6869 Other thrombophilia: Secondary | ICD-10-CM | POA: Diagnosis not present

## 2021-09-20 DIAGNOSIS — M9900 Segmental and somatic dysfunction of head region: Secondary | ICD-10-CM | POA: Diagnosis not present

## 2021-09-20 DIAGNOSIS — I700 Atherosclerosis of aorta: Secondary | ICD-10-CM | POA: Diagnosis not present

## 2021-09-20 DIAGNOSIS — M9907 Segmental and somatic dysfunction of upper extremity: Secondary | ICD-10-CM | POA: Diagnosis not present

## 2021-09-20 DIAGNOSIS — E1169 Type 2 diabetes mellitus with other specified complication: Secondary | ICD-10-CM | POA: Diagnosis not present

## 2021-09-20 DIAGNOSIS — M546 Pain in thoracic spine: Secondary | ICD-10-CM | POA: Diagnosis not present

## 2021-09-20 DIAGNOSIS — M9902 Segmental and somatic dysfunction of thoracic region: Secondary | ICD-10-CM | POA: Diagnosis not present

## 2021-09-20 DIAGNOSIS — E785 Hyperlipidemia, unspecified: Secondary | ICD-10-CM | POA: Diagnosis not present

## 2021-09-20 DIAGNOSIS — Z0000 Encounter for general adult medical examination without abnormal findings: Secondary | ICD-10-CM | POA: Diagnosis not present

## 2021-09-20 DIAGNOSIS — I10 Essential (primary) hypertension: Secondary | ICD-10-CM | POA: Diagnosis not present

## 2021-09-20 DIAGNOSIS — R5383 Other fatigue: Secondary | ICD-10-CM | POA: Diagnosis not present

## 2021-09-20 DIAGNOSIS — E039 Hypothyroidism, unspecified: Secondary | ICD-10-CM | POA: Diagnosis not present

## 2021-09-21 DIAGNOSIS — Z1231 Encounter for screening mammogram for malignant neoplasm of breast: Secondary | ICD-10-CM | POA: Diagnosis not present

## 2021-10-03 DIAGNOSIS — R319 Hematuria, unspecified: Secondary | ICD-10-CM | POA: Diagnosis not present

## 2021-10-03 DIAGNOSIS — N390 Urinary tract infection, site not specified: Secondary | ICD-10-CM | POA: Diagnosis not present

## 2021-10-03 DIAGNOSIS — N898 Other specified noninflammatory disorders of vagina: Secondary | ICD-10-CM | POA: Diagnosis not present

## 2021-10-03 DIAGNOSIS — R300 Dysuria: Secondary | ICD-10-CM | POA: Diagnosis not present

## 2021-10-08 DIAGNOSIS — L820 Inflamed seborrheic keratosis: Secondary | ICD-10-CM | POA: Diagnosis not present

## 2021-10-08 DIAGNOSIS — L570 Actinic keratosis: Secondary | ICD-10-CM | POA: Diagnosis not present

## 2021-10-10 DIAGNOSIS — N907 Vulvar cyst: Secondary | ICD-10-CM | POA: Diagnosis not present

## 2021-10-10 DIAGNOSIS — N9489 Other specified conditions associated with female genital organs and menstrual cycle: Secondary | ICD-10-CM | POA: Diagnosis not present

## 2021-10-15 DIAGNOSIS — M9902 Segmental and somatic dysfunction of thoracic region: Secondary | ICD-10-CM | POA: Diagnosis not present

## 2021-10-15 DIAGNOSIS — M5134 Other intervertebral disc degeneration, thoracic region: Secondary | ICD-10-CM | POA: Diagnosis not present

## 2021-10-24 DIAGNOSIS — R509 Fever, unspecified: Secondary | ICD-10-CM | POA: Diagnosis not present

## 2021-10-24 DIAGNOSIS — M791 Myalgia, unspecified site: Secondary | ICD-10-CM | POA: Diagnosis not present

## 2021-10-24 DIAGNOSIS — R110 Nausea: Secondary | ICD-10-CM | POA: Diagnosis not present

## 2021-10-24 DIAGNOSIS — R519 Headache, unspecified: Secondary | ICD-10-CM | POA: Diagnosis not present

## 2021-10-24 DIAGNOSIS — J111 Influenza due to unidentified influenza virus with other respiratory manifestations: Secondary | ICD-10-CM | POA: Diagnosis not present

## 2021-12-02 DIAGNOSIS — K219 Gastro-esophageal reflux disease without esophagitis: Secondary | ICD-10-CM | POA: Diagnosis not present

## 2021-12-02 DIAGNOSIS — G8929 Other chronic pain: Secondary | ICD-10-CM | POA: Diagnosis not present

## 2021-12-02 DIAGNOSIS — M1990 Unspecified osteoarthritis, unspecified site: Secondary | ICD-10-CM | POA: Diagnosis not present

## 2021-12-02 DIAGNOSIS — E039 Hypothyroidism, unspecified: Secondary | ICD-10-CM | POA: Diagnosis not present

## 2021-12-02 DIAGNOSIS — I700 Atherosclerosis of aorta: Secondary | ICD-10-CM | POA: Diagnosis not present

## 2021-12-02 DIAGNOSIS — I10 Essential (primary) hypertension: Secondary | ICD-10-CM | POA: Diagnosis not present

## 2021-12-02 DIAGNOSIS — Z791 Long term (current) use of non-steroidal anti-inflammatories (NSAID): Secondary | ICD-10-CM | POA: Diagnosis not present

## 2021-12-02 DIAGNOSIS — Z008 Encounter for other general examination: Secondary | ICD-10-CM | POA: Diagnosis not present

## 2021-12-02 DIAGNOSIS — H547 Unspecified visual loss: Secondary | ICD-10-CM | POA: Diagnosis not present

## 2021-12-02 DIAGNOSIS — E785 Hyperlipidemia, unspecified: Secondary | ICD-10-CM | POA: Diagnosis not present

## 2021-12-02 DIAGNOSIS — Z7984 Long term (current) use of oral hypoglycemic drugs: Secondary | ICD-10-CM | POA: Diagnosis not present

## 2021-12-02 DIAGNOSIS — E1142 Type 2 diabetes mellitus with diabetic polyneuropathy: Secondary | ICD-10-CM | POA: Diagnosis not present

## 2021-12-02 DIAGNOSIS — H409 Unspecified glaucoma: Secondary | ICD-10-CM | POA: Diagnosis not present

## 2021-12-05 DIAGNOSIS — Z23 Encounter for immunization: Secondary | ICD-10-CM | POA: Diagnosis not present

## 2022-01-01 DIAGNOSIS — E1169 Type 2 diabetes mellitus with other specified complication: Secondary | ICD-10-CM | POA: Diagnosis not present

## 2022-01-29 DIAGNOSIS — H43393 Other vitreous opacities, bilateral: Secondary | ICD-10-CM | POA: Diagnosis not present

## 2022-01-29 DIAGNOSIS — H401132 Primary open-angle glaucoma, bilateral, moderate stage: Secondary | ICD-10-CM | POA: Diagnosis not present

## 2022-01-29 DIAGNOSIS — Z961 Presence of intraocular lens: Secondary | ICD-10-CM | POA: Diagnosis not present

## 2022-01-29 DIAGNOSIS — H35341 Macular cyst, hole, or pseudohole, right eye: Secondary | ICD-10-CM | POA: Diagnosis not present

## 2022-01-29 DIAGNOSIS — Z7984 Long term (current) use of oral hypoglycemic drugs: Secondary | ICD-10-CM | POA: Diagnosis not present

## 2022-01-29 DIAGNOSIS — H524 Presbyopia: Secondary | ICD-10-CM | POA: Diagnosis not present

## 2022-01-29 DIAGNOSIS — E119 Type 2 diabetes mellitus without complications: Secondary | ICD-10-CM | POA: Diagnosis not present

## 2022-01-31 DIAGNOSIS — S4991XA Unspecified injury of right shoulder and upper arm, initial encounter: Secondary | ICD-10-CM | POA: Diagnosis not present

## 2022-02-11 DIAGNOSIS — D6869 Other thrombophilia: Secondary | ICD-10-CM | POA: Diagnosis not present

## 2022-02-11 DIAGNOSIS — M25511 Pain in right shoulder: Secondary | ICD-10-CM | POA: Diagnosis not present

## 2022-02-11 DIAGNOSIS — R5383 Other fatigue: Secondary | ICD-10-CM | POA: Diagnosis not present

## 2022-02-11 DIAGNOSIS — E785 Hyperlipidemia, unspecified: Secondary | ICD-10-CM | POA: Diagnosis not present

## 2022-02-11 DIAGNOSIS — E1169 Type 2 diabetes mellitus with other specified complication: Secondary | ICD-10-CM | POA: Diagnosis not present

## 2022-02-11 DIAGNOSIS — I700 Atherosclerosis of aorta: Secondary | ICD-10-CM | POA: Diagnosis not present

## 2022-02-11 DIAGNOSIS — M546 Pain in thoracic spine: Secondary | ICD-10-CM | POA: Diagnosis not present

## 2022-02-11 DIAGNOSIS — I10 Essential (primary) hypertension: Secondary | ICD-10-CM | POA: Diagnosis not present

## 2022-02-11 DIAGNOSIS — E039 Hypothyroidism, unspecified: Secondary | ICD-10-CM | POA: Diagnosis not present

## 2022-02-11 DIAGNOSIS — M9901 Segmental and somatic dysfunction of cervical region: Secondary | ICD-10-CM | POA: Diagnosis not present

## 2022-02-11 DIAGNOSIS — M9900 Segmental and somatic dysfunction of head region: Secondary | ICD-10-CM | POA: Diagnosis not present

## 2022-02-11 DIAGNOSIS — R2681 Unsteadiness on feet: Secondary | ICD-10-CM | POA: Diagnosis not present

## 2022-02-18 DIAGNOSIS — M79621 Pain in right upper arm: Secondary | ICD-10-CM | POA: Diagnosis not present

## 2022-02-25 DIAGNOSIS — M79621 Pain in right upper arm: Secondary | ICD-10-CM | POA: Diagnosis not present

## 2022-03-03 DIAGNOSIS — M25511 Pain in right shoulder: Secondary | ICD-10-CM | POA: Diagnosis not present

## 2022-03-03 DIAGNOSIS — S42201D Unspecified fracture of upper end of right humerus, subsequent encounter for fracture with routine healing: Secondary | ICD-10-CM | POA: Diagnosis not present

## 2022-03-10 DIAGNOSIS — M25511 Pain in right shoulder: Secondary | ICD-10-CM | POA: Diagnosis not present

## 2022-03-10 DIAGNOSIS — S42201D Unspecified fracture of upper end of right humerus, subsequent encounter for fracture with routine healing: Secondary | ICD-10-CM | POA: Diagnosis not present

## 2022-03-14 DIAGNOSIS — S42201D Unspecified fracture of upper end of right humerus, subsequent encounter for fracture with routine healing: Secondary | ICD-10-CM | POA: Diagnosis not present

## 2022-03-14 DIAGNOSIS — M25511 Pain in right shoulder: Secondary | ICD-10-CM | POA: Diagnosis not present

## 2022-03-18 DIAGNOSIS — S42201D Unspecified fracture of upper end of right humerus, subsequent encounter for fracture with routine healing: Secondary | ICD-10-CM | POA: Diagnosis not present

## 2022-03-18 DIAGNOSIS — M25511 Pain in right shoulder: Secondary | ICD-10-CM | POA: Diagnosis not present

## 2022-03-21 DIAGNOSIS — S42201D Unspecified fracture of upper end of right humerus, subsequent encounter for fracture with routine healing: Secondary | ICD-10-CM | POA: Diagnosis not present

## 2022-03-21 DIAGNOSIS — M25511 Pain in right shoulder: Secondary | ICD-10-CM | POA: Diagnosis not present

## 2022-03-25 DIAGNOSIS — S42201D Unspecified fracture of upper end of right humerus, subsequent encounter for fracture with routine healing: Secondary | ICD-10-CM | POA: Diagnosis not present

## 2022-03-25 DIAGNOSIS — M25511 Pain in right shoulder: Secondary | ICD-10-CM | POA: Diagnosis not present

## 2022-03-28 DIAGNOSIS — S42201D Unspecified fracture of upper end of right humerus, subsequent encounter for fracture with routine healing: Secondary | ICD-10-CM | POA: Diagnosis not present

## 2022-03-28 DIAGNOSIS — M25511 Pain in right shoulder: Secondary | ICD-10-CM | POA: Diagnosis not present

## 2022-03-31 DIAGNOSIS — M25511 Pain in right shoulder: Secondary | ICD-10-CM | POA: Diagnosis not present

## 2022-03-31 DIAGNOSIS — S42201D Unspecified fracture of upper end of right humerus, subsequent encounter for fracture with routine healing: Secondary | ICD-10-CM | POA: Diagnosis not present

## 2022-04-01 DIAGNOSIS — M25511 Pain in right shoulder: Secondary | ICD-10-CM | POA: Diagnosis not present

## 2022-04-02 DIAGNOSIS — M25511 Pain in right shoulder: Secondary | ICD-10-CM | POA: Diagnosis not present

## 2022-04-02 DIAGNOSIS — S42201D Unspecified fracture of upper end of right humerus, subsequent encounter for fracture with routine healing: Secondary | ICD-10-CM | POA: Diagnosis not present

## 2022-04-07 DIAGNOSIS — M25511 Pain in right shoulder: Secondary | ICD-10-CM | POA: Diagnosis not present

## 2022-04-07 DIAGNOSIS — S42201D Unspecified fracture of upper end of right humerus, subsequent encounter for fracture with routine healing: Secondary | ICD-10-CM | POA: Diagnosis not present

## 2022-04-09 DIAGNOSIS — M25511 Pain in right shoulder: Secondary | ICD-10-CM | POA: Diagnosis not present

## 2022-04-09 DIAGNOSIS — S42201D Unspecified fracture of upper end of right humerus, subsequent encounter for fracture with routine healing: Secondary | ICD-10-CM | POA: Diagnosis not present

## 2022-04-12 DIAGNOSIS — J029 Acute pharyngitis, unspecified: Secondary | ICD-10-CM | POA: Diagnosis not present

## 2022-04-17 DIAGNOSIS — Z780 Asymptomatic menopausal state: Secondary | ICD-10-CM | POA: Diagnosis not present

## 2022-04-23 DIAGNOSIS — I10 Essential (primary) hypertension: Secondary | ICD-10-CM | POA: Diagnosis not present

## 2022-04-23 DIAGNOSIS — E785 Hyperlipidemia, unspecified: Secondary | ICD-10-CM | POA: Diagnosis not present

## 2022-04-23 DIAGNOSIS — E1169 Type 2 diabetes mellitus with other specified complication: Secondary | ICD-10-CM | POA: Diagnosis not present

## 2022-04-24 DIAGNOSIS — E785 Hyperlipidemia, unspecified: Secondary | ICD-10-CM | POA: Diagnosis not present

## 2022-04-24 DIAGNOSIS — R5383 Other fatigue: Secondary | ICD-10-CM | POA: Diagnosis not present

## 2022-04-24 DIAGNOSIS — D6869 Other thrombophilia: Secondary | ICD-10-CM | POA: Diagnosis not present

## 2022-04-24 DIAGNOSIS — I700 Atherosclerosis of aorta: Secondary | ICD-10-CM | POA: Diagnosis not present

## 2022-04-24 DIAGNOSIS — I10 Essential (primary) hypertension: Secondary | ICD-10-CM | POA: Diagnosis not present

## 2022-04-24 DIAGNOSIS — R2681 Unsteadiness on feet: Secondary | ICD-10-CM | POA: Diagnosis not present

## 2022-04-24 DIAGNOSIS — E1169 Type 2 diabetes mellitus with other specified complication: Secondary | ICD-10-CM | POA: Diagnosis not present

## 2022-04-24 DIAGNOSIS — M546 Pain in thoracic spine: Secondary | ICD-10-CM | POA: Diagnosis not present

## 2022-04-24 DIAGNOSIS — Q782 Osteopetrosis: Secondary | ICD-10-CM | POA: Diagnosis not present

## 2022-04-24 DIAGNOSIS — E039 Hypothyroidism, unspecified: Secondary | ICD-10-CM | POA: Diagnosis not present

## 2022-04-24 DIAGNOSIS — G629 Polyneuropathy, unspecified: Secondary | ICD-10-CM | POA: Diagnosis not present

## 2022-04-24 DIAGNOSIS — R6881 Early satiety: Secondary | ICD-10-CM | POA: Diagnosis not present

## 2022-04-28 DIAGNOSIS — L570 Actinic keratosis: Secondary | ICD-10-CM | POA: Diagnosis not present

## 2022-04-28 DIAGNOSIS — L821 Other seborrheic keratosis: Secondary | ICD-10-CM | POA: Diagnosis not present

## 2022-04-28 DIAGNOSIS — L820 Inflamed seborrheic keratosis: Secondary | ICD-10-CM | POA: Diagnosis not present

## 2022-04-28 DIAGNOSIS — L578 Other skin changes due to chronic exposure to nonionizing radiation: Secondary | ICD-10-CM | POA: Diagnosis not present

## 2022-04-28 DIAGNOSIS — C44321 Squamous cell carcinoma of skin of nose: Secondary | ICD-10-CM | POA: Diagnosis not present

## 2022-05-19 DIAGNOSIS — H524 Presbyopia: Secondary | ICD-10-CM | POA: Diagnosis not present

## 2022-05-26 DIAGNOSIS — N390 Urinary tract infection, site not specified: Secondary | ICD-10-CM | POA: Diagnosis not present

## 2022-05-26 DIAGNOSIS — R300 Dysuria: Secondary | ICD-10-CM | POA: Diagnosis not present

## 2022-06-05 DIAGNOSIS — Z961 Presence of intraocular lens: Secondary | ICD-10-CM | POA: Diagnosis not present

## 2022-06-05 DIAGNOSIS — H43393 Other vitreous opacities, bilateral: Secondary | ICD-10-CM | POA: Diagnosis not present

## 2022-06-05 DIAGNOSIS — H401132 Primary open-angle glaucoma, bilateral, moderate stage: Secondary | ICD-10-CM | POA: Diagnosis not present

## 2022-06-05 DIAGNOSIS — E119 Type 2 diabetes mellitus without complications: Secondary | ICD-10-CM | POA: Diagnosis not present

## 2022-06-05 DIAGNOSIS — Z7984 Long term (current) use of oral hypoglycemic drugs: Secondary | ICD-10-CM | POA: Diagnosis not present

## 2022-06-05 DIAGNOSIS — H35341 Macular cyst, hole, or pseudohole, right eye: Secondary | ICD-10-CM | POA: Diagnosis not present

## 2022-06-05 DIAGNOSIS — H524 Presbyopia: Secondary | ICD-10-CM | POA: Diagnosis not present

## 2022-07-17 DIAGNOSIS — M722 Plantar fascial fibromatosis: Secondary | ICD-10-CM | POA: Diagnosis not present

## 2022-07-17 DIAGNOSIS — M79672 Pain in left foot: Secondary | ICD-10-CM | POA: Diagnosis not present

## 2022-08-08 DIAGNOSIS — G2581 Restless legs syndrome: Secondary | ICD-10-CM | POA: Diagnosis not present

## 2022-08-08 DIAGNOSIS — M722 Plantar fascial fibromatosis: Secondary | ICD-10-CM | POA: Diagnosis not present

## 2022-08-08 DIAGNOSIS — E1169 Type 2 diabetes mellitus with other specified complication: Secondary | ICD-10-CM | POA: Diagnosis not present

## 2022-08-08 DIAGNOSIS — M79672 Pain in left foot: Secondary | ICD-10-CM | POA: Diagnosis not present

## 2022-08-26 DIAGNOSIS — M79672 Pain in left foot: Secondary | ICD-10-CM | POA: Diagnosis not present

## 2022-08-26 DIAGNOSIS — M722 Plantar fascial fibromatosis: Secondary | ICD-10-CM | POA: Diagnosis not present

## 2022-09-30 DIAGNOSIS — E039 Hypothyroidism, unspecified: Secondary | ICD-10-CM | POA: Diagnosis not present

## 2022-09-30 DIAGNOSIS — E785 Hyperlipidemia, unspecified: Secondary | ICD-10-CM | POA: Diagnosis not present

## 2022-09-30 DIAGNOSIS — R5383 Other fatigue: Secondary | ICD-10-CM | POA: Diagnosis not present

## 2022-09-30 DIAGNOSIS — R7989 Other specified abnormal findings of blood chemistry: Secondary | ICD-10-CM | POA: Diagnosis not present

## 2022-09-30 DIAGNOSIS — E1169 Type 2 diabetes mellitus with other specified complication: Secondary | ICD-10-CM | POA: Diagnosis not present

## 2022-09-30 DIAGNOSIS — I10 Essential (primary) hypertension: Secondary | ICD-10-CM | POA: Diagnosis not present

## 2022-10-04 DIAGNOSIS — Z1231 Encounter for screening mammogram for malignant neoplasm of breast: Secondary | ICD-10-CM | POA: Diagnosis not present

## 2022-10-07 DIAGNOSIS — I700 Atherosclerosis of aorta: Secondary | ICD-10-CM | POA: Diagnosis not present

## 2022-10-07 DIAGNOSIS — I10 Essential (primary) hypertension: Secondary | ICD-10-CM | POA: Diagnosis not present

## 2022-10-07 DIAGNOSIS — E1169 Type 2 diabetes mellitus with other specified complication: Secondary | ICD-10-CM | POA: Diagnosis not present

## 2022-10-07 DIAGNOSIS — D6869 Other thrombophilia: Secondary | ICD-10-CM | POA: Diagnosis not present

## 2022-10-07 DIAGNOSIS — Q782 Osteopetrosis: Secondary | ICD-10-CM | POA: Diagnosis not present

## 2022-10-07 DIAGNOSIS — E785 Hyperlipidemia, unspecified: Secondary | ICD-10-CM | POA: Diagnosis not present

## 2022-10-07 DIAGNOSIS — R202 Paresthesia of skin: Secondary | ICD-10-CM | POA: Diagnosis not present

## 2022-10-07 DIAGNOSIS — Z23 Encounter for immunization: Secondary | ICD-10-CM | POA: Diagnosis not present

## 2022-10-07 DIAGNOSIS — E039 Hypothyroidism, unspecified: Secondary | ICD-10-CM | POA: Diagnosis not present

## 2022-10-07 DIAGNOSIS — R5383 Other fatigue: Secondary | ICD-10-CM | POA: Diagnosis not present

## 2022-10-07 DIAGNOSIS — R6881 Early satiety: Secondary | ICD-10-CM | POA: Diagnosis not present

## 2022-10-07 DIAGNOSIS — M9902 Segmental and somatic dysfunction of thoracic region: Secondary | ICD-10-CM | POA: Diagnosis not present

## 2022-10-07 DIAGNOSIS — Z1389 Encounter for screening for other disorder: Secondary | ICD-10-CM | POA: Diagnosis not present

## 2022-10-07 DIAGNOSIS — Z0000 Encounter for general adult medical examination without abnormal findings: Secondary | ICD-10-CM | POA: Diagnosis not present

## 2022-10-07 DIAGNOSIS — Z1331 Encounter for screening for depression: Secondary | ICD-10-CM | POA: Diagnosis not present

## 2022-11-10 DIAGNOSIS — M62838 Other muscle spasm: Secondary | ICD-10-CM | POA: Diagnosis not present

## 2022-11-10 DIAGNOSIS — M546 Pain in thoracic spine: Secondary | ICD-10-CM | POA: Diagnosis not present

## 2022-11-18 DIAGNOSIS — M62838 Other muscle spasm: Secondary | ICD-10-CM | POA: Diagnosis not present

## 2022-11-18 DIAGNOSIS — M546 Pain in thoracic spine: Secondary | ICD-10-CM | POA: Diagnosis not present

## 2022-11-25 DIAGNOSIS — M546 Pain in thoracic spine: Secondary | ICD-10-CM | POA: Diagnosis not present

## 2022-11-25 DIAGNOSIS — M62838 Other muscle spasm: Secondary | ICD-10-CM | POA: Diagnosis not present

## 2022-12-02 DIAGNOSIS — M546 Pain in thoracic spine: Secondary | ICD-10-CM | POA: Diagnosis not present

## 2022-12-02 DIAGNOSIS — M62838 Other muscle spasm: Secondary | ICD-10-CM | POA: Diagnosis not present

## 2022-12-09 DIAGNOSIS — M62838 Other muscle spasm: Secondary | ICD-10-CM | POA: Diagnosis not present

## 2022-12-09 DIAGNOSIS — M546 Pain in thoracic spine: Secondary | ICD-10-CM | POA: Diagnosis not present

## 2022-12-16 DIAGNOSIS — M62838 Other muscle spasm: Secondary | ICD-10-CM | POA: Diagnosis not present

## 2022-12-16 DIAGNOSIS — M546 Pain in thoracic spine: Secondary | ICD-10-CM | POA: Diagnosis not present

## 2022-12-23 DIAGNOSIS — M62838 Other muscle spasm: Secondary | ICD-10-CM | POA: Diagnosis not present

## 2022-12-23 DIAGNOSIS — M546 Pain in thoracic spine: Secondary | ICD-10-CM | POA: Diagnosis not present

## 2022-12-30 DIAGNOSIS — M62838 Other muscle spasm: Secondary | ICD-10-CM | POA: Diagnosis not present

## 2022-12-30 DIAGNOSIS — M546 Pain in thoracic spine: Secondary | ICD-10-CM | POA: Diagnosis not present

## 2023-01-13 DIAGNOSIS — M62838 Other muscle spasm: Secondary | ICD-10-CM | POA: Diagnosis not present

## 2023-01-13 DIAGNOSIS — M546 Pain in thoracic spine: Secondary | ICD-10-CM | POA: Diagnosis not present

## 2023-03-24 DIAGNOSIS — E119 Type 2 diabetes mellitus without complications: Secondary | ICD-10-CM | POA: Diagnosis not present

## 2023-03-24 DIAGNOSIS — H524 Presbyopia: Secondary | ICD-10-CM | POA: Diagnosis not present

## 2023-03-24 DIAGNOSIS — H43393 Other vitreous opacities, bilateral: Secondary | ICD-10-CM | POA: Diagnosis not present

## 2023-03-24 DIAGNOSIS — Z961 Presence of intraocular lens: Secondary | ICD-10-CM | POA: Diagnosis not present

## 2023-03-24 DIAGNOSIS — H401132 Primary open-angle glaucoma, bilateral, moderate stage: Secondary | ICD-10-CM | POA: Diagnosis not present

## 2023-03-24 DIAGNOSIS — H35341 Macular cyst, hole, or pseudohole, right eye: Secondary | ICD-10-CM | POA: Diagnosis not present

## 2023-03-24 DIAGNOSIS — Z7984 Long term (current) use of oral hypoglycemic drugs: Secondary | ICD-10-CM | POA: Diagnosis not present

## 2023-04-07 DIAGNOSIS — E119 Type 2 diabetes mellitus without complications: Secondary | ICD-10-CM | POA: Diagnosis not present

## 2023-04-07 DIAGNOSIS — I10 Essential (primary) hypertension: Secondary | ICD-10-CM | POA: Diagnosis not present

## 2023-04-07 DIAGNOSIS — Z791 Long term (current) use of non-steroidal anti-inflammatories (NSAID): Secondary | ICD-10-CM | POA: Diagnosis not present

## 2023-04-07 DIAGNOSIS — Z7984 Long term (current) use of oral hypoglycemic drugs: Secondary | ICD-10-CM | POA: Diagnosis not present

## 2023-04-07 DIAGNOSIS — Z803 Family history of malignant neoplasm of breast: Secondary | ICD-10-CM | POA: Diagnosis not present

## 2023-04-07 DIAGNOSIS — G2581 Restless legs syndrome: Secondary | ICD-10-CM | POA: Diagnosis not present

## 2023-04-07 DIAGNOSIS — E039 Hypothyroidism, unspecified: Secondary | ICD-10-CM | POA: Diagnosis not present

## 2023-04-07 DIAGNOSIS — M1990 Unspecified osteoarthritis, unspecified site: Secondary | ICD-10-CM | POA: Diagnosis not present

## 2023-04-07 DIAGNOSIS — Z8249 Family history of ischemic heart disease and other diseases of the circulatory system: Secondary | ICD-10-CM | POA: Diagnosis not present

## 2023-04-07 DIAGNOSIS — Z823 Family history of stroke: Secondary | ICD-10-CM | POA: Diagnosis not present

## 2023-04-07 DIAGNOSIS — Z85828 Personal history of other malignant neoplasm of skin: Secondary | ICD-10-CM | POA: Diagnosis not present

## 2023-04-07 DIAGNOSIS — G3184 Mild cognitive impairment, so stated: Secondary | ICD-10-CM | POA: Diagnosis not present

## 2023-04-09 DIAGNOSIS — Q782 Osteopetrosis: Secondary | ICD-10-CM | POA: Diagnosis not present

## 2023-04-09 DIAGNOSIS — M546 Pain in thoracic spine: Secondary | ICD-10-CM | POA: Diagnosis not present

## 2023-04-09 DIAGNOSIS — D6869 Other thrombophilia: Secondary | ICD-10-CM | POA: Diagnosis not present

## 2023-04-09 DIAGNOSIS — E039 Hypothyroidism, unspecified: Secondary | ICD-10-CM | POA: Diagnosis not present

## 2023-04-09 DIAGNOSIS — R6881 Early satiety: Secondary | ICD-10-CM | POA: Diagnosis not present

## 2023-04-09 DIAGNOSIS — I10 Essential (primary) hypertension: Secondary | ICD-10-CM | POA: Diagnosis not present

## 2023-04-09 DIAGNOSIS — M9902 Segmental and somatic dysfunction of thoracic region: Secondary | ICD-10-CM | POA: Diagnosis not present

## 2023-04-09 DIAGNOSIS — R202 Paresthesia of skin: Secondary | ICD-10-CM | POA: Diagnosis not present

## 2023-04-09 DIAGNOSIS — E1169 Type 2 diabetes mellitus with other specified complication: Secondary | ICD-10-CM | POA: Diagnosis not present

## 2023-04-09 DIAGNOSIS — I700 Atherosclerosis of aorta: Secondary | ICD-10-CM | POA: Diagnosis not present

## 2023-06-29 DIAGNOSIS — G2581 Restless legs syndrome: Secondary | ICD-10-CM | POA: Diagnosis not present

## 2023-06-29 DIAGNOSIS — M9902 Segmental and somatic dysfunction of thoracic region: Secondary | ICD-10-CM | POA: Diagnosis not present

## 2023-06-29 DIAGNOSIS — I10 Essential (primary) hypertension: Secondary | ICD-10-CM | POA: Diagnosis not present

## 2023-06-29 DIAGNOSIS — R519 Headache, unspecified: Secondary | ICD-10-CM | POA: Diagnosis not present

## 2023-06-29 DIAGNOSIS — E785 Hyperlipidemia, unspecified: Secondary | ICD-10-CM | POA: Diagnosis not present

## 2023-06-29 DIAGNOSIS — E1169 Type 2 diabetes mellitus with other specified complication: Secondary | ICD-10-CM | POA: Diagnosis not present

## 2023-06-29 DIAGNOSIS — Q782 Osteopetrosis: Secondary | ICD-10-CM | POA: Diagnosis not present

## 2023-06-29 DIAGNOSIS — R2681 Unsteadiness on feet: Secondary | ICD-10-CM | POA: Diagnosis not present

## 2023-07-16 DIAGNOSIS — I10 Essential (primary) hypertension: Secondary | ICD-10-CM | POA: Diagnosis not present

## 2023-07-16 DIAGNOSIS — Q782 Osteopetrosis: Secondary | ICD-10-CM | POA: Diagnosis not present

## 2023-07-16 DIAGNOSIS — E1169 Type 2 diabetes mellitus with other specified complication: Secondary | ICD-10-CM | POA: Diagnosis not present

## 2023-08-13 DIAGNOSIS — M1711 Unilateral primary osteoarthritis, right knee: Secondary | ICD-10-CM | POA: Diagnosis not present

## 2023-08-21 ENCOUNTER — Other Ambulatory Visit (HOSPITAL_COMMUNITY): Payer: Self-pay | Admitting: *Deleted

## 2023-08-24 ENCOUNTER — Ambulatory Visit (HOSPITAL_COMMUNITY)
Admission: RE | Admit: 2023-08-24 | Discharge: 2023-08-24 | Disposition: A | Discharge status: Home / Self Care | Payer: Medicare HMO | Source: Ambulatory Visit | Attending: Internal Medicine | Admitting: Internal Medicine

## 2023-08-24 DIAGNOSIS — M810 Age-related osteoporosis without current pathological fracture: Secondary | ICD-10-CM | POA: Insufficient documentation

## 2023-08-24 MED ORDER — DENOSUMAB 60 MG/ML ~~LOC~~ SOSY
PREFILLED_SYRINGE | SUBCUTANEOUS | Status: AC
  Filled 2023-08-24: qty 1

## 2023-08-24 MED ORDER — DENOSUMAB 60 MG/ML ~~LOC~~ SOSY
60.0000 mg | PREFILLED_SYRINGE | Freq: Once | SUBCUTANEOUS | Status: AC
  Administered 2023-08-24: 60 mg via SUBCUTANEOUS

## 2023-09-10 DIAGNOSIS — M546 Pain in thoracic spine: Secondary | ICD-10-CM | POA: Diagnosis not present

## 2023-09-10 DIAGNOSIS — R319 Hematuria, unspecified: Secondary | ICD-10-CM | POA: Diagnosis not present

## 2023-09-10 DIAGNOSIS — E1169 Type 2 diabetes mellitus with other specified complication: Secondary | ICD-10-CM | POA: Diagnosis not present

## 2023-09-10 DIAGNOSIS — Q782 Osteopetrosis: Secondary | ICD-10-CM | POA: Diagnosis not present

## 2023-09-22 DIAGNOSIS — H35341 Macular cyst, hole, or pseudohole, right eye: Secondary | ICD-10-CM | POA: Diagnosis not present

## 2023-09-22 DIAGNOSIS — H401132 Primary open-angle glaucoma, bilateral, moderate stage: Secondary | ICD-10-CM | POA: Diagnosis not present

## 2023-09-22 DIAGNOSIS — E119 Type 2 diabetes mellitus without complications: Secondary | ICD-10-CM | POA: Diagnosis not present

## 2023-09-22 DIAGNOSIS — H524 Presbyopia: Secondary | ICD-10-CM | POA: Diagnosis not present

## 2023-09-22 DIAGNOSIS — H43393 Other vitreous opacities, bilateral: Secondary | ICD-10-CM | POA: Diagnosis not present

## 2023-09-22 DIAGNOSIS — Z961 Presence of intraocular lens: Secondary | ICD-10-CM | POA: Diagnosis not present

## 2023-09-22 DIAGNOSIS — Z7984 Long term (current) use of oral hypoglycemic drugs: Secondary | ICD-10-CM | POA: Diagnosis not present

## 2023-10-05 DIAGNOSIS — E785 Hyperlipidemia, unspecified: Secondary | ICD-10-CM | POA: Diagnosis not present

## 2023-10-12 DIAGNOSIS — Z1231 Encounter for screening mammogram for malignant neoplasm of breast: Secondary | ICD-10-CM | POA: Diagnosis not present

## 2023-10-13 DIAGNOSIS — R2681 Unsteadiness on feet: Secondary | ICD-10-CM | POA: Diagnosis not present

## 2023-10-13 DIAGNOSIS — E785 Hyperlipidemia, unspecified: Secondary | ICD-10-CM | POA: Diagnosis not present

## 2023-10-13 DIAGNOSIS — E039 Hypothyroidism, unspecified: Secondary | ICD-10-CM | POA: Diagnosis not present

## 2023-10-13 DIAGNOSIS — I700 Atherosclerosis of aorta: Secondary | ICD-10-CM | POA: Diagnosis not present

## 2023-10-13 DIAGNOSIS — R202 Paresthesia of skin: Secondary | ICD-10-CM | POA: Diagnosis not present

## 2023-10-13 DIAGNOSIS — Q782 Osteopetrosis: Secondary | ICD-10-CM | POA: Diagnosis not present

## 2023-10-13 DIAGNOSIS — E1169 Type 2 diabetes mellitus with other specified complication: Secondary | ICD-10-CM | POA: Diagnosis not present

## 2023-10-13 DIAGNOSIS — G629 Polyneuropathy, unspecified: Secondary | ICD-10-CM | POA: Diagnosis not present

## 2023-10-13 DIAGNOSIS — Z1339 Encounter for screening examination for other mental health and behavioral disorders: Secondary | ICD-10-CM | POA: Diagnosis not present

## 2023-10-13 DIAGNOSIS — Z23 Encounter for immunization: Secondary | ICD-10-CM | POA: Diagnosis not present

## 2023-10-13 DIAGNOSIS — G2581 Restless legs syndrome: Secondary | ICD-10-CM | POA: Diagnosis not present

## 2023-10-13 DIAGNOSIS — Z1331 Encounter for screening for depression: Secondary | ICD-10-CM | POA: Diagnosis not present

## 2023-10-13 DIAGNOSIS — Z0000 Encounter for general adult medical examination without abnormal findings: Secondary | ICD-10-CM | POA: Diagnosis not present

## 2023-10-13 DIAGNOSIS — R6881 Early satiety: Secondary | ICD-10-CM | POA: Diagnosis not present

## 2023-10-13 DIAGNOSIS — I10 Essential (primary) hypertension: Secondary | ICD-10-CM | POA: Diagnosis not present

## 2023-11-09 DIAGNOSIS — H524 Presbyopia: Secondary | ICD-10-CM | POA: Diagnosis not present

## 2023-11-09 DIAGNOSIS — H43393 Other vitreous opacities, bilateral: Secondary | ICD-10-CM | POA: Diagnosis not present

## 2023-11-09 DIAGNOSIS — H401132 Primary open-angle glaucoma, bilateral, moderate stage: Secondary | ICD-10-CM | POA: Diagnosis not present

## 2023-11-09 DIAGNOSIS — Z7984 Long term (current) use of oral hypoglycemic drugs: Secondary | ICD-10-CM | POA: Diagnosis not present

## 2023-11-09 DIAGNOSIS — E119 Type 2 diabetes mellitus without complications: Secondary | ICD-10-CM | POA: Diagnosis not present

## 2023-11-09 DIAGNOSIS — Z961 Presence of intraocular lens: Secondary | ICD-10-CM | POA: Diagnosis not present

## 2023-11-09 DIAGNOSIS — H35341 Macular cyst, hole, or pseudohole, right eye: Secondary | ICD-10-CM | POA: Diagnosis not present

## 2023-12-10 DIAGNOSIS — Z008 Encounter for other general examination: Secondary | ICD-10-CM | POA: Diagnosis not present

## 2024-01-18 DIAGNOSIS — I700 Atherosclerosis of aorta: Secondary | ICD-10-CM | POA: Diagnosis not present

## 2024-01-18 DIAGNOSIS — E785 Hyperlipidemia, unspecified: Secondary | ICD-10-CM | POA: Diagnosis not present

## 2024-01-18 DIAGNOSIS — E1169 Type 2 diabetes mellitus with other specified complication: Secondary | ICD-10-CM | POA: Diagnosis not present

## 2024-01-18 DIAGNOSIS — R5383 Other fatigue: Secondary | ICD-10-CM | POA: Diagnosis not present

## 2024-01-18 DIAGNOSIS — E039 Hypothyroidism, unspecified: Secondary | ICD-10-CM | POA: Diagnosis not present

## 2024-01-18 DIAGNOSIS — Q782 Osteopetrosis: Secondary | ICD-10-CM | POA: Diagnosis not present

## 2024-01-18 DIAGNOSIS — I10 Essential (primary) hypertension: Secondary | ICD-10-CM | POA: Diagnosis not present

## 2024-01-18 DIAGNOSIS — G629 Polyneuropathy, unspecified: Secondary | ICD-10-CM | POA: Diagnosis not present

## 2024-02-15 ENCOUNTER — Other Ambulatory Visit: Payer: Self-pay | Admitting: *Deleted

## 2024-02-15 DIAGNOSIS — R42 Dizziness and giddiness: Secondary | ICD-10-CM

## 2024-03-01 DIAGNOSIS — R2681 Unsteadiness on feet: Secondary | ICD-10-CM | POA: Diagnosis not present

## 2024-03-01 DIAGNOSIS — M810 Age-related osteoporosis without current pathological fracture: Secondary | ICD-10-CM | POA: Diagnosis not present

## 2024-03-01 DIAGNOSIS — I10 Essential (primary) hypertension: Secondary | ICD-10-CM | POA: Diagnosis not present

## 2024-03-01 DIAGNOSIS — R5383 Other fatigue: Secondary | ICD-10-CM | POA: Diagnosis not present

## 2024-03-01 DIAGNOSIS — E1169 Type 2 diabetes mellitus with other specified complication: Secondary | ICD-10-CM | POA: Diagnosis not present

## 2024-03-01 DIAGNOSIS — G629 Polyneuropathy, unspecified: Secondary | ICD-10-CM | POA: Diagnosis not present

## 2024-03-01 DIAGNOSIS — E039 Hypothyroidism, unspecified: Secondary | ICD-10-CM | POA: Diagnosis not present

## 2024-03-01 DIAGNOSIS — R202 Paresthesia of skin: Secondary | ICD-10-CM | POA: Diagnosis not present

## 2024-03-01 DIAGNOSIS — R42 Dizziness and giddiness: Secondary | ICD-10-CM | POA: Diagnosis not present

## 2024-03-01 DIAGNOSIS — E785 Hyperlipidemia, unspecified: Secondary | ICD-10-CM | POA: Diagnosis not present

## 2024-03-01 DIAGNOSIS — R6881 Early satiety: Secondary | ICD-10-CM | POA: Diagnosis not present

## 2024-03-01 DIAGNOSIS — I700 Atherosclerosis of aorta: Secondary | ICD-10-CM | POA: Diagnosis not present

## 2024-05-06 DIAGNOSIS — Z961 Presence of intraocular lens: Secondary | ICD-10-CM | POA: Diagnosis not present

## 2024-05-06 DIAGNOSIS — H43393 Other vitreous opacities, bilateral: Secondary | ICD-10-CM | POA: Diagnosis not present

## 2024-05-06 DIAGNOSIS — H35341 Macular cyst, hole, or pseudohole, right eye: Secondary | ICD-10-CM | POA: Diagnosis not present

## 2024-05-06 DIAGNOSIS — H401132 Primary open-angle glaucoma, bilateral, moderate stage: Secondary | ICD-10-CM | POA: Diagnosis not present

## 2024-05-06 DIAGNOSIS — E559 Vitamin D deficiency, unspecified: Secondary | ICD-10-CM | POA: Diagnosis not present

## 2024-05-06 DIAGNOSIS — H524 Presbyopia: Secondary | ICD-10-CM | POA: Diagnosis not present

## 2024-05-06 DIAGNOSIS — E119 Type 2 diabetes mellitus without complications: Secondary | ICD-10-CM | POA: Diagnosis not present

## 2024-05-06 DIAGNOSIS — Z7984 Long term (current) use of oral hypoglycemic drugs: Secondary | ICD-10-CM | POA: Diagnosis not present

## 2024-07-04 NOTE — Progress Notes (Signed)
    [name] [address] [place] [address] Phone: [phone]   Assessment and Plan:     There are no diagnoses linked to this encounter.  ***   Pertinent previous records reviewed include ***    Follow Up: ***     Subjective:   I, Hilari Wethington, am serving as a Neurosurgeon for Doctor Morene Mace  Chief Complaint: thoracic back pain   HPI:   07/05/2024 Patient is a 89 year old female with thoracic  back pain. Patient states   Relevant Historical Information: ***  Additional pertinent review of systems negative.   Current Outpatient Medications:    diclofenac  (VOLTAREN ) 75 MG EC tablet, Take 1 tablet (75 mg total) by mouth 2 (two) times daily., Disp: 30 tablet, Rfl: 0   hydrochlorothiazide (HYDRODIURIL) 12.5 MG tablet, TAKE 1 TABLET BY MOUTH ONCE DAILY FOR BLOOD PRESSURE, Disp: , Rfl:    latanoprost (XALATAN) 0.005 % ophthalmic solution, INSTILL 1 DROP INTO BOTH EYES EVERY DAY AT BEDTIME, Disp: , Rfl:    levothyroxine (SYNTHROID, LEVOTHROID) 50 MCG tablet, Take 50 mcg by mouth daily before breakfast., Disp: , Rfl:    lisinopril (PRINIVIL,ZESTRIL) 20 MG tablet, Take 20 mg by mouth daily., Disp: , Rfl:    lisinopril (ZESTRIL) 40 MG tablet, Take 40 mg by mouth daily., Disp: , Rfl:    metFORMIN (GLUCOPHAGE) 500 MG tablet, Take by mouth 2 (two) times daily with a meal., Disp: , Rfl:    Objective:     There were no vitals filed for this visit.    There is no height or weight on file to calculate BMI.    Physical Exam:    ***   Electronically signed by:  Odis Mace D.CLEMENTEEN AMYE [name] 1:05 PM 07/04/24

## 2024-07-05 ENCOUNTER — Ambulatory Visit (INDEPENDENT_AMBULATORY_CARE_PROVIDER_SITE_OTHER)

## 2024-07-05 ENCOUNTER — Ambulatory Visit: Admitting: Sports Medicine

## 2024-07-05 VITALS — BP 130/84 | HR 70 | Ht 63.0 in | Wt 142.0 lb

## 2024-07-05 DIAGNOSIS — M546 Pain in thoracic spine: Secondary | ICD-10-CM | POA: Diagnosis not present

## 2024-07-05 DIAGNOSIS — M4125 Other idiopathic scoliosis, thoracolumbar region: Secondary | ICD-10-CM

## 2024-07-05 DIAGNOSIS — M5134 Other intervertebral disc degeneration, thoracic region: Secondary | ICD-10-CM

## 2024-07-05 DIAGNOSIS — M898X8 Other specified disorders of bone, other site: Secondary | ICD-10-CM | POA: Diagnosis not present

## 2024-07-05 DIAGNOSIS — M549 Dorsalgia, unspecified: Secondary | ICD-10-CM | POA: Diagnosis not present

## 2024-07-05 DIAGNOSIS — G8929 Other chronic pain: Secondary | ICD-10-CM

## 2024-07-05 MED ORDER — MELOXICAM 15 MG PO TABS: ORAL_TABLET | ORAL | 0 refills | Status: DC

## 2024-07-05 NOTE — Patient Instructions (Signed)
-   Start meloxicam  15 mg daily x2 weeks.  If still having pain after 2 weeks, complete 3rd-week of NSAID. May use remaining NSAID as needed once daily for pain control.  Do not to use additional over-the-counter NSAIDs (ibuprofen, naproxen, Advil, Aleve, etc.) while taking prescription NSAIDs.  May use Tylenol [phone] mg 2 to 3 times a day for breakthrough pain.  Low back HEP  PT [facility]   4 week follow up

## 2024-07-18 ENCOUNTER — Ambulatory Visit: Payer: Self-pay | Admitting: Sports Medicine

## 2024-07-18 DIAGNOSIS — I10 Essential (primary) hypertension: Secondary | ICD-10-CM | POA: Diagnosis not present

## 2024-08-02 NOTE — Progress Notes (Unsigned)
 [name] [address] [place] [address] Phone: [phone]   Assessment and Plan:     1. Chronic right-sided thoracic back pain (Primary) 2. Other idiopathic scoliosis, thoracolumbar region 3. DDD (degenerative disc disease), thoracic -Chronic with exacerbation, subsequent visit - Continued middle back pain primarily at the base of shoulder blades with mild improvement after completing meloxicam  course, however consistent day-to-day pain with physical activity.  Still most consistent with musculoskeletal flare with underlying mild scoliosis and degenerative changes in thoracolumbar spine - Referral placed at previous office visit for physical therapy, however PT location stated they were not able to reach patient, and patient states they never heard from PT.  Will resend physical therapy referral and provide patient with a phone number so that she can establish care - Discontinue meloxicam  - Use Tylenol 500 to 1000 mg tablets 2-3 times a day for day-to-day pain relief - May use ibuprofen 200 to 400 mg daily as needed for breakthrough pain.  Recommend limiting chronic NSAIDs to 1-2 doses per week    Pertinent previous records reviewed include none   Follow Up: 6 to 8 weeks for reevaluation.  Could consider advanced imaging   Subjective:   I, Alonzo Loving, am serving as a Neurosurgeon for Doctor Morene Mace   Chief Complaint: thoracic back pain    HPI:    07/05/2024 Patient is a 89 year old female with thoracic  back pain. Patient states pain for years but has progressed over the last 2 weeks. No MOI. Advil for the pain and that helps. No radiating pain. Bilateral thoracic back pain but left is typically worse. Decreased ROM  08/03/2024 Patient states she has intermittent pain when she cooks      Relevant Historical Information: None pertinent  Additional pertinent review of systems negative.   Current Outpatient  Medications:    hydrochlorothiazide (HYDRODIURIL) 12.5 MG tablet, TAKE 1 TABLET BY MOUTH ONCE DAILY FOR BLOOD PRESSURE, Disp: , Rfl:    latanoprost (XALATAN) 0.005 % ophthalmic solution, INSTILL 1 DROP INTO BOTH EYES EVERY DAY AT BEDTIME, Disp: , Rfl:    levothyroxine (SYNTHROID, LEVOTHROID) 50 MCG tablet, Take 50 mcg by mouth daily before breakfast., Disp: , Rfl:    lisinopril (PRINIVIL,ZESTRIL) 20 MG tablet, Take 20 mg by mouth daily., Disp: , Rfl:    lisinopril (ZESTRIL) 40 MG tablet, Take 40 mg by mouth daily., Disp: , Rfl:    meloxicam  (MOBIC ) 15 MG tablet, Take 1 tablet daily for 2 weeks.  If still in pain after 2 weeks, take 1 tablet daily for an additional 1 week., Disp: 30 tablet, Rfl: 0   metFORMIN (GLUCOPHAGE) 500 MG tablet, Take by mouth 2 (two) times daily with a meal., Disp: , Rfl:    Objective:     Vitals:   08/03/24 1059  Pulse: 65  SpO2: 97%  Weight: 145 lb (65.8 kg)  Height: 5' 3 (1.6 m)      Body mass index is 25.69 kg/m.    Physical Exam:    Gen: Appears well, nad, nontoxic and pleasant Psych: Alert and oriented, appropriate mood and affect Neuro: sensation intact, strength is 5/5 in upper and lower extremities, muscle tone wnl Skin: no susupicious lesions or rashes   Back - Normal skin, Spine with mild thoracolumbar scoliosis alignment and no deformity.   No tenderness to vertebral process palpation.   Right thoracolumbar paraspinous muscles are   tender and without spasm NTTP gluteal  musculature Straight leg raise negative  Piriformis Test negative Gait normal     Electronically signed by:  Odis Mace D.CLEMENTEEN AMYE [name] 11:13 AM 08/03/24

## 2024-08-03 ENCOUNTER — Ambulatory Visit: Admitting: Sports Medicine

## 2024-08-03 VITALS — HR 65 | Ht 63.0 in | Wt 145.0 lb

## 2024-08-03 DIAGNOSIS — G8929 Other chronic pain: Secondary | ICD-10-CM | POA: Diagnosis not present

## 2024-08-03 DIAGNOSIS — M5134 Other intervertebral disc degeneration, thoracic region: Secondary | ICD-10-CM | POA: Diagnosis not present

## 2024-08-03 DIAGNOSIS — M4125 Other idiopathic scoliosis, thoracolumbar region: Secondary | ICD-10-CM | POA: Diagnosis not present

## 2024-08-03 NOTE — Patient Instructions (Addendum)
 PT [phone]  Tylenol [phone] mg 2-3 times a day for pain relief   Ibuprofen 200-400 mg limit 1-2 times per week

## 2024-08-16 DIAGNOSIS — I10 Essential (primary) hypertension: Secondary | ICD-10-CM | POA: Diagnosis not present

## 2024-09-14 NOTE — Progress Notes (Unsigned)
 Amber [name] D.[name] [address] [place] [address] Phone: [phone]   Assessment and Plan:     1. Chronic right-sided thoracic back pain (Primary) 2. Other idiopathic scoliosis, thoracolumbar region 3. DDD (degenerative disc disease), thoracic 4. Chronic bilateral low back pain without sciatica -Chronic with exacerbation, subsequent visit - Continued middle to lower back pain with no significant improvement despite conservative therapy including meloxicam  course, relative rest, HEP - Recommend further evaluation with MRI of thoracic and lumbar spine based on degenerative changes seen on x-rays, if no improvement despite >6 weeks of conservative therapy, pain with day-to-day activities, pain >6/10 - Use Tylenol 500 to 1000 mg tablets 2-3 times a day for day-to-day pain relief - May use ibuprofen 200 to 400 mg daily as needed for breakthrough pain.  Recommend limiting chronic NSAIDs to 1-2 doses per week.  Patient did not feel significant benefit with meloxicam .    Pertinent previous records reviewed include none   Follow Up: 1 week after MRI to review results and discuss treatment plan   Subjective:   I, Amber [name], am serving as a Neurosurgeon for Amber [name]   Chief Complaint: thoracic back pain    HPI:    07/05/2024 Patient is a 89 year old female with thoracic  back pain. Patient states pain for years but has progressed over the last 2 weeks. No MOI. Advil for the pain and that helps. No radiating pain. Bilateral thoracic back pain but left is typically worse. Decreased ROM   08/03/2024 Patient states she has intermittent pain when she cooks   09/15/2024 Patient states she is the same      Relevant Historical Information: None pertinent    Additional pertinent review of systems negative.   Current Outpatient Medications:    hydrochlorothiazide (HYDRODIURIL) 12.5 MG tablet, TAKE 1 TABLET BY MOUTH ONCE  DAILY FOR BLOOD PRESSURE, Disp: , Rfl:    latanoprost (XALATAN) 0.005 % ophthalmic solution, INSTILL 1 DROP INTO BOTH EYES EVERY DAY AT BEDTIME, Disp: , Rfl:    levothyroxine (SYNTHROID, LEVOTHROID) 50 MCG tablet, Take 50 mcg by mouth daily before breakfast., Disp: , Rfl:    lisinopril (PRINIVIL,ZESTRIL) 20 MG tablet, Take 20 mg by mouth daily., Disp: , Rfl:    lisinopril (ZESTRIL) 40 MG tablet, Take 40 mg by mouth daily., Disp: , Rfl:    meloxicam  (MOBIC ) 15 MG tablet, Take 1 tablet daily for 2 weeks.  If still in pain after 2 weeks, take 1 tablet daily for an additional 1 week., Disp: 30 tablet, Rfl: 0   metFORMIN (GLUCOPHAGE) 500 MG tablet, Take by mouth 2 (two) times daily with a meal., Disp: , Rfl:    Objective:     Vitals:   09/15/24 1052  Pulse: 65  SpO2: 98%  Weight: 146 lb (66.2 kg)  Height: 5' 3 (1.6 m)      Body mass index is 25.86 kg/m.    Physical Exam:    Gen: Appears well, nad, nontoxic and pleasant Psych: Alert and oriented, appropriate mood and affect Neuro: sensation intact, strength is 5/5 in upper and lower extremities, muscle tone wnl Skin: no susupicious lesions or rashes   Back - Normal skin, Spine with mild thoracolumbar scoliosis alignment and no deformity.     tenderness to vertebral process palpation.   Right thoracolumbar paraspinous muscles are   tender and without spasm NTTP gluteal musculature Straight leg raise negative  Piriformis Test negative Gait antalgic  with slow and short steps    Electronically signed by:  Odis [name] D.[name] 11:05 AM 09/15/24

## 2024-09-15 ENCOUNTER — Ambulatory Visit: Admitting: Sports Medicine

## 2024-09-15 ENCOUNTER — Ambulatory Visit (INDEPENDENT_AMBULATORY_CARE_PROVIDER_SITE_OTHER)

## 2024-09-15 VITALS — HR 65 | Ht 63.0 in | Wt 146.0 lb

## 2024-09-15 DIAGNOSIS — M4125 Other idiopathic scoliosis, thoracolumbar region: Secondary | ICD-10-CM

## 2024-09-15 DIAGNOSIS — M48061 Spinal stenosis, lumbar region without neurogenic claudication: Secondary | ICD-10-CM | POA: Diagnosis not present

## 2024-09-15 DIAGNOSIS — M47817 Spondylosis without myelopathy or radiculopathy, lumbosacral region: Secondary | ICD-10-CM | POA: Diagnosis not present

## 2024-09-15 DIAGNOSIS — M4316 Spondylolisthesis, lumbar region: Secondary | ICD-10-CM | POA: Diagnosis not present

## 2024-09-15 DIAGNOSIS — M5134 Other intervertebral disc degeneration, thoracic region: Secondary | ICD-10-CM

## 2024-09-15 DIAGNOSIS — M545 Low back pain, unspecified: Secondary | ICD-10-CM

## 2024-09-15 DIAGNOSIS — G8929 Other chronic pain: Secondary | ICD-10-CM

## 2024-09-15 NOTE — Patient Instructions (Addendum)
 Xrays on the way out   MRI referral   Follow up 7 days after MRI to discuss results

## 2024-10-13 DIAGNOSIS — Z1231 Encounter for screening mammogram for malignant neoplasm of breast: Secondary | ICD-10-CM | POA: Diagnosis not present

## 2024-10-14 ENCOUNTER — Encounter: Payer: Self-pay | Admitting: Sports Medicine

## 2024-10-25 ENCOUNTER — Ambulatory Visit
Admission: RE | Admit: 2024-10-25 | Discharge: 2024-10-25 | Disposition: A | Source: Ambulatory Visit | Attending: Sports Medicine

## 2024-10-25 DIAGNOSIS — M545 Low back pain, unspecified: Secondary | ICD-10-CM

## 2024-10-25 DIAGNOSIS — M546 Pain in thoracic spine: Secondary | ICD-10-CM | POA: Diagnosis not present

## 2024-10-25 DIAGNOSIS — M4125 Other idiopathic scoliosis, thoracolumbar region: Secondary | ICD-10-CM

## 2024-10-25 DIAGNOSIS — M5134 Other intervertebral disc degeneration, thoracic region: Secondary | ICD-10-CM

## 2024-10-25 DIAGNOSIS — G8929 Other chronic pain: Secondary | ICD-10-CM

## 2024-10-25 DIAGNOSIS — M5126 Other intervertebral disc displacement, lumbar region: Secondary | ICD-10-CM | POA: Diagnosis not present

## 2024-10-25 DIAGNOSIS — M47816 Spondylosis without myelopathy or radiculopathy, lumbar region: Secondary | ICD-10-CM | POA: Diagnosis not present

## 2024-10-29 DIAGNOSIS — W1830XA Fall on same level, unspecified, initial encounter: Secondary | ICD-10-CM | POA: Diagnosis not present

## 2024-10-29 DIAGNOSIS — Z79899 Other long term (current) drug therapy: Secondary | ICD-10-CM | POA: Diagnosis not present

## 2024-10-29 DIAGNOSIS — M545 Low back pain, unspecified: Secondary | ICD-10-CM | POA: Diagnosis not present

## 2024-10-29 DIAGNOSIS — Z7984 Long term (current) use of oral hypoglycemic drugs: Secondary | ICD-10-CM | POA: Diagnosis not present

## 2024-10-29 DIAGNOSIS — W1839XA Other fall on same level, initial encounter: Secondary | ICD-10-CM | POA: Diagnosis not present

## 2024-10-29 DIAGNOSIS — S39012A Strain of muscle, fascia and tendon of lower back, initial encounter: Secondary | ICD-10-CM | POA: Diagnosis not present

## 2024-10-29 DIAGNOSIS — S3992XA Unspecified injury of lower back, initial encounter: Secondary | ICD-10-CM | POA: Diagnosis not present

## 2024-10-29 DIAGNOSIS — E119 Type 2 diabetes mellitus without complications: Secondary | ICD-10-CM | POA: Diagnosis not present

## 2024-10-31 DIAGNOSIS — N6321 Unspecified lump in the left breast, upper outer quadrant: Secondary | ICD-10-CM | POA: Diagnosis not present

## 2024-10-31 DIAGNOSIS — R928 Other abnormal and inconclusive findings on diagnostic imaging of breast: Secondary | ICD-10-CM | POA: Diagnosis not present

## 2024-11-07 NOTE — Progress Notes (Unsigned)
 Amber [name] Amber [name] [name] [address] [place] [address] Phone: [phone]   Assessment and Plan:     1. Chronic right-sided thoracic back pain (Primary) 2. Other idiopathic scoliosis, thoracolumbar region 3. DDD (degenerative disc disease), thoracic 4. Chronic bilateral low back pain without sciatica -Chronic with exacerbation, subsequent visit - Continued upper and lower back pain with no significant improvement.  Patient's pain is most concentrated T5-T8 bilaterally based on HPI and physical exam - Reviewed patient's thoracic and lumbar MRIs.  Discussed degenerative changes, incidental finding of hemangiomas, lower lumbar stenosis.  Patient is not experiencing symptoms consistent with lumbar radiculopathy.  Patient's pain may be caused by degenerative changes seen in thoracic spine.  Recommend epidural CSI to right sided T6-T7 - Continue HEP and start physical therapy for upper and lower back.  Referral sent - Use ibuprofen 200-400 daily as needed for breakthrough pain.  Recommend limiting chronic NSAIDs to 1-2 doses per week to prevent long-term side effects. Use Tylenol 500 to 1000 mg tablets 2-3 times a day as needed for day-to-day pain relief.    -Start Robaxin  500 mg daily as needed for muscle spasms.  Patient educated that medication may make her drowsy  Pertinent previous records reviewed include none  Patient accompanied by her daughter throughout entirety of office visit  Follow Up: 6 to 8 weeks for reevaluation.  Would review effectiveness of epidural and physical therapy.   Subjective:   I, Amber [name], am serving as a neurosurgeon for Amber [name]   Chief Complaint: thoracic back pain    HPI:    07/05/2024 Patient is a 89 year old female with thoracic  back pain. Patient states pain for years but has progressed over the last 2 weeks. No MOI. Advil for the pain and that helps. No radiating pain. Bilateral thoracic  back pain but left is typically worse. Decreased ROM   08/03/2024 Patient states she has intermittent pain when she cooks    09/15/2024 Patient states she is the same    11/08/2024 Patient states she is the same   Relevant Historical Information: None pertinent    Additional pertinent review of systems negative.  Current Medications[1]   Objective:     Vitals:   11/08/24 1247  BP: 136/84  Pulse: (!) 58  SpO2: 97%  Weight: 148 lb (67.1 kg)  Height: 5' 3 (1.6 m)      Body mass index is 26.22 kg/m.    Physical Exam:    Gen: Appears well, nad, nontoxic and pleasant Psych: Alert and oriented, appropriate mood and affect Neuro: sensation intact, strength is 5/5 in upper and lower extremities, muscle tone wnl Skin: no susupicious lesions or rashes   Back - Normal skin, Spine with mild thoracolumbar scoliosis alignment and no deformity.     tenderness to vertebral process palpation.   Bilateral thoracolumbar paraspinous muscles are   tender and without spasm.  Pain is most intense T5-T7 on the right NTTP gluteal musculature Straight leg raise negative  Piriformis Test negative Gait antalgic with slow and short steps    Electronically signed by:  Amber [name] Amber [name] [name] 1:11 PM 11/08/2024     [1]  Current Outpatient Medications:    methocarbamol  (ROBAXIN ) 500 MG tablet, Take 1 tablet (500 mg total) by mouth daily as needed for muscle spasms., Disp: 30 tablet, Rfl: 1   hydrochlorothiazide (HYDRODIURIL) 12.5 MG tablet, TAKE 1 TABLET BY MOUTH ONCE DAILY FOR  BLOOD PRESSURE, Disp: , Rfl:    latanoprost (XALATAN) 0.005 % ophthalmic solution, INSTILL 1 DROP INTO BOTH EYES EVERY DAY AT BEDTIME, Disp: , Rfl:    levothyroxine (SYNTHROID, LEVOTHROID) 50 MCG tablet, Take 50 mcg by mouth daily before breakfast., Disp: , Rfl:    lisinopril (PRINIVIL,ZESTRIL) 20 MG tablet, Take 20 mg by mouth daily., Disp: , Rfl:    lisinopril (ZESTRIL) 40 MG tablet, Take  40 mg by mouth daily., Disp: , Rfl:    meloxicam  (MOBIC ) 15 MG tablet, Take 1 tablet daily for 2 weeks.  If still in pain after 2 weeks, take 1 tablet daily for an additional 1 week., Disp: 30 tablet, Rfl: 0   metFORMIN (GLUCOPHAGE) 500 MG tablet, Take by mouth 2 (two) times daily with a meal., Disp: , Rfl:

## 2024-11-08 ENCOUNTER — Ambulatory Visit: Admitting: Sports Medicine

## 2024-11-08 VITALS — BP 136/84 | HR 58 | Ht 63.0 in | Wt 148.0 lb

## 2024-11-08 DIAGNOSIS — M545 Low back pain, unspecified: Secondary | ICD-10-CM | POA: Diagnosis not present

## 2024-11-08 DIAGNOSIS — G8929 Other chronic pain: Secondary | ICD-10-CM | POA: Diagnosis not present

## 2024-11-08 DIAGNOSIS — M4125 Other idiopathic scoliosis, thoracolumbar region: Secondary | ICD-10-CM

## 2024-11-08 DIAGNOSIS — M5134 Other intervertebral disc degeneration, thoracic region: Secondary | ICD-10-CM

## 2024-11-08 MED ORDER — METHOCARBAMOL 500 MG PO TABS: 500.0000 mg | ORAL_TABLET | Freq: Every day | ORAL | 1 refills | Status: DC | PRN

## 2024-11-08 NOTE — Patient Instructions (Addendum)
 Epidural right T6-7  PT referral   - Start ibuprofen 200-400 mg .  If still having pain after 2 weeks, complete 3rd-week of NSAID. May use remaining NSAID as needed once daily for pain control.  Do not to use additional over-the-counter NSAIDs (ibuprofen, naproxen, Advil, Aleve, etc.) while taking prescription NSAIDs.  May use Tylenol [phone] mg 2 to 3 times a day for breakthrough pain.  Robaxin  500 mg daily as needed --- will make you drowsy   Continue HEP   6-8 week follow up  ROBAXIN  = NO DRIVING

## 2024-11-09 DIAGNOSIS — C50412 Malignant neoplasm of upper-outer quadrant of left female breast: Secondary | ICD-10-CM | POA: Diagnosis not present

## 2024-11-09 DIAGNOSIS — N6321 Unspecified lump in the left breast, upper outer quadrant: Secondary | ICD-10-CM | POA: Diagnosis not present

## 2024-11-11 ENCOUNTER — Telehealth: Payer: Self-pay | Admitting: *Deleted

## 2024-11-11 NOTE — Telephone Encounter (Signed)
 LVM for patients daughter to return my call in reference to the [facility] on 1/7

## 2024-11-11 NOTE — Telephone Encounter (Signed)
 Spoke to patient's daughter to confirm upcoming afternoon [facility] clinic appointment on 1/7, paperwork will be sent via [facility] location and time, also informed patient that the surgeon's office would be calling as well to get information from them similar to the packet that they will be receiving so make sure to do both.  Reminded patient that all providers will be coming to the clinic to see them HERE and if they had any questions to not hesitate to reach back out to myself or their navigators.

## 2024-11-14 ENCOUNTER — Telehealth: Payer: Self-pay | Admitting: Sports Medicine

## 2024-11-14 NOTE — Telephone Encounter (Signed)
 Patient called and stated that she was supposed to do some therapy and maybe get a shot in her back. She has just found out she has a spot in her breast that is cancer and she is not going to be able to therapy right now as she is needing to deal with that and may not be able to do the shot either. She asked to speak to Dr. Keller assistant and if someone would like to call her back that would be great per patient. Please advise.

## 2024-11-28 DIAGNOSIS — Z171 Estrogen receptor negative status [ER-]: Secondary | ICD-10-CM | POA: Insufficient documentation

## 2024-11-28 NOTE — Progress Notes (Addendum)
 " Radiation Oncology         [phone] ________________________________  Name: Amber [name]        MRN: [id number]  Date of Service: 11/30/2024 DOB: Mar 31, 1934  RR:Dxjxoz, Massie, DO  [name], MD     REFERRING PHYSICIAN: [name], MD   DIAGNOSIS: There were no encounter diagnoses.   HISTORY OF PRESENT ILLNESS: Amber [name] is a 89 y.o. female seen in the multidisciplinary breast clinic for a new diagnosis of *** breast cancer. The patient was noted to have ***.    PREVIOUS RADIATION THERAPY: {EXAM; YES/NO:19492::No}   PAST MEDICAL HISTORY: No past medical history on file.     PAST SURGICAL HISTORY:No past surgical history on file.   FAMILY HISTORY: No family history on file.   SOCIAL HISTORY:  reports that she has never smoked. She has never used smokeless tobacco.  The patient is married and lives in [place].  She***   ALLERGIES: Patient has no allergy information on record.   MEDICATIONS:  Current Outpatient Medications  Medication Sig Dispense Refill   hydrochlorothiazide (HYDRODIURIL) 12.5 MG tablet TAKE 1 TABLET BY MOUTH ONCE DAILY FOR BLOOD PRESSURE     latanoprost (XALATAN) 0.005 % ophthalmic solution INSTILL 1 DROP INTO BOTH EYES EVERY DAY AT BEDTIME     levothyroxine (SYNTHROID, LEVOTHROID) 50 MCG tablet Take 50 mcg by mouth daily before breakfast.     lisinopril (PRINIVIL,ZESTRIL) 20 MG tablet Take 20 mg by mouth daily.     lisinopril (ZESTRIL) 40 MG tablet Take 40 mg by mouth daily.     meloxicam  (MOBIC ) 15 MG tablet Take 1 tablet daily for 2 weeks.  If still in pain after 2 weeks, take 1 tablet daily for an additional 1 week. 30 tablet 0   metFORMIN (GLUCOPHAGE) 500 MG tablet Take by mouth 2 (two) times daily with a meal.     methocarbamol  (ROBAXIN ) 500 MG tablet Take 1 tablet (500 mg total) by mouth daily as needed for muscle spasms. 30 tablet 1   No current facility-administered medications for this visit.     REVIEW OF  SYSTEMS: On review of systems, the patient reports that she is doing ***     PHYSICAL EXAM:  Wt Readings from Last 3 Encounters:  11/08/24 148 lb (67.1 kg)  09/15/24 146 lb (66.2 kg)  08/03/24 145 lb (65.8 kg)   Temp Readings from Last 3 Encounters:  08/24/23 (!) 97.5 F (36.4 C) (Temporal)  07/21/19 97.9 F (36.6 C)  02/11/19 98 F (36.7 C)   BP Readings from Last 3 Encounters:  11/08/24 136/84  07/05/24 130/84  08/24/23 (!) 168/66   Pulse Readings from Last 3 Encounters:  11/08/24 (!) 58  09/15/24 65  08/03/24 65    In general this is a well appearing *** female in no acute distress. She's alert and oriented x4 and appropriate throughout the examination. Cardiopulmonary assessment is negative for acute distress and she exhibits normal effort. Bilateral breast exam is deferred.    ECOG = ***  0 - Asymptomatic (Fully active, able to carry on all predisease activities without restriction)  1 - Symptomatic but completely ambulatory (Restricted in physically strenuous activity but ambulatory and able to carry out work of a light or sedentary nature. For example, light housework, office work)  2 - Symptomatic, <50% in bed during the day (Ambulatory and capable of all self care but unable to carry out any work activities. Up and about more than 50%  of waking hours)  3 - Symptomatic, >50% in bed, but not bedbound (Capable of only limited self-care, confined to bed or chair 50% or more of waking hours)  4 - Bedbound (Completely disabled. Cannot carry on any self-care. Totally confined to bed or chair)  5 - Death   Raylene MM, Creech RH, Tormey DC, et al. [phone]). Toxicity and response criteria of the [facility]. Am. DOROTHA Bridges. Oncol. 5 (6): 649-55    LABORATORY DATA:  Lab Results  Component Value Date   WBC 10.1 01/19/2008   HGB 15.3 (H) 01/19/2008   HCT 43.5 01/19/2008   MCV 86.2 01/19/2008   PLT 308 01/19/2008   Lab Results  Component Value  Date   NA 133 (L) 01/19/2008   K 3.9 01/19/2008   CL 93 (L) 01/19/2008   CO2 31 01/19/2008   Lab Results  Component Value Date   ALT 17 01/19/2008   AST 16 01/19/2008   ALKPHOS 76 01/19/2008   BILITOT 2.0 (H) 01/19/2008      RADIOGRAPHY: No results found.     IMPRESSION/PLAN: 1. ***   In a visit lasting *** minutes, greater than 50% of the time was spent face to face reviewing her case, as well as in preparation of, discussing, and coordinating the patient's care.  The above documentation reflects my direct findings during this shared patient visit. Please see the separate note by Dr. Dewey on this date for the remainder of the patient's plan of care.    Donald KYM Husband, [facility]    **Disclaimer: This note was dictated with voice recognition software. Similar sounding words can inadvertently be transcribed and this note may contain transcription errors which may not have been corrected upon publication of note.** "

## 2024-11-30 ENCOUNTER — Ambulatory Visit: Attending: General Surgery | Admitting: Physical Therapy

## 2024-11-30 ENCOUNTER — Encounter: Payer: Self-pay | Admitting: *Deleted

## 2024-11-30 ENCOUNTER — Inpatient Hospital Stay: Admitting: Genetic Counselor

## 2024-11-30 ENCOUNTER — Encounter: Payer: Self-pay | Admitting: Genetic Counselor

## 2024-11-30 ENCOUNTER — Other Ambulatory Visit: Payer: Self-pay | Admitting: General Surgery

## 2024-11-30 ENCOUNTER — Inpatient Hospital Stay: Attending: Hematology and Oncology | Admitting: Hematology and Oncology

## 2024-11-30 ENCOUNTER — Ambulatory Visit
Admission: RE | Admit: 2024-11-30 | Discharge: 2024-11-30 | Disposition: A | Discharge status: Home / Self Care | Source: Ambulatory Visit | Attending: Radiation Oncology | Admitting: Radiation Oncology

## 2024-11-30 ENCOUNTER — Encounter: Payer: Self-pay | Admitting: Physical Therapy

## 2024-11-30 ENCOUNTER — Other Ambulatory Visit: Payer: Self-pay

## 2024-11-30 ENCOUNTER — Inpatient Hospital Stay

## 2024-11-30 VITALS — BP 179/64 | HR 66 | Temp 98.3°F | Resp 18 | Ht 63.78 in | Wt 145.7 lb

## 2024-11-30 DIAGNOSIS — Z171 Estrogen receptor negative status [ER-]: Secondary | ICD-10-CM

## 2024-11-30 DIAGNOSIS — C50412 Malignant neoplasm of upper-outer quadrant of left female breast: Secondary | ICD-10-CM | POA: Diagnosis not present

## 2024-11-30 DIAGNOSIS — R293 Abnormal posture: Secondary | ICD-10-CM | POA: Diagnosis present

## 2024-11-30 DIAGNOSIS — Z803 Family history of malignant neoplasm of breast: Secondary | ICD-10-CM

## 2024-11-30 DIAGNOSIS — Z8051 Family history of malignant neoplasm of kidney: Secondary | ICD-10-CM

## 2024-11-30 LAB — CMP (CANCER CENTER ONLY)
ALT: 14 U/L (ref 0–44)
AST: 21 U/L (ref 15–41)
Albumin: 4.4 g/dL (ref 3.5–5.0)
Alkaline Phosphatase: 78 U/L (ref 38–126)
Anion gap: 8 (ref 5–15)
BUN: 17 mg/dL (ref 8–23)
CO2: 27 mmol/L (ref 22–32)
Calcium: 9.8 mg/dL (ref 8.9–10.3)
Chloride: 103 mmol/L (ref 98–111)
Creatinine: 0.92 mg/dL (ref 0.44–1.00)
GFR, Estimated: 59 mL/min — ABNORMAL LOW
Glucose, Bld: 138 mg/dL — ABNORMAL HIGH (ref 70–99)
Potassium: 4.6 mmol/L (ref 3.5–5.1)
Sodium: 139 mmol/L (ref 135–145)
Total Bilirubin: 1.3 mg/dL — ABNORMAL HIGH (ref 0.0–1.2)
Total Protein: 7.1 g/dL (ref 6.5–8.1)

## 2024-11-30 LAB — CBC WITH DIFFERENTIAL (CANCER CENTER ONLY)
Abs Immature Granulocytes: 0.02 K/uL (ref 0.00–0.07)
Basophils Absolute: 0 K/uL (ref 0.0–0.1)
Basophils Relative: 1 %
Eosinophils Absolute: 0.3 K/uL (ref 0.0–0.5)
Eosinophils Relative: 5 %
HCT: 39.3 % (ref 36.0–46.0)
Hemoglobin: 13.4 g/dL (ref 12.0–15.0)
Immature Granulocytes: 0 %
Lymphocytes Relative: 20 %
Lymphs Abs: 1.1 K/uL (ref 0.7–4.0)
MCH: 29 pg (ref 26.0–34.0)
MCHC: 34.1 g/dL (ref 30.0–36.0)
MCV: 85.1 fL (ref 80.0–100.0)
Monocytes Absolute: 0.5 K/uL (ref 0.1–1.0)
Monocytes Relative: 9 %
Neutro Abs: 3.6 K/uL (ref 1.7–7.7)
Neutrophils Relative %: 65 %
Platelet Count: 235 K/uL (ref 150–400)
RBC: 4.62 MIL/uL (ref 3.87–5.11)
RDW: 12.4 % (ref 11.5–15.5)
WBC Count: 5.4 K/uL (ref 4.0–10.5)
nRBC: 0 % (ref 0.0–0.2)

## 2024-11-30 LAB — GENETIC SCREENING ORDER

## 2024-11-30 NOTE — Assessment & Plan Note (Signed)
 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 After reviewing the pathology in detail, we proceeded to discuss the different treatment options between surgery, radiation, and antiestrogen therapies.  Treatment plan: Breast conserving surgery Given her advanced age I do not recommend adjuvant chemotherapy We will repeat breast prognostic panel on the final pathology to confirm that it is estrogen  receptor negative. Plus or minus adjuvant radiation

## 2024-11-30 NOTE — Therapy (Signed)
 " OUTPATIENT PHYSICAL THERAPY BREAST CANCER BASELINE EVALUATION   Patient Name: Amber [name] MRN: [id number] DOB:10-20-1934, 89 y.o., female Today's Date: 11/30/2024  END OF SESSION:  PT End of Session - 11/30/24 1551     Visit Number 1    Number of Visits 2    Date for Recertification  01/25/25    PT Start Time 1450    PT Stop Time 1520    PT Time Calculation (min) 30 min    Activity Tolerance Patient tolerated treatment well    Behavior During Therapy WFL for tasks assessed/performed          Past Medical History:  Diagnosis Date   Diabetes (HCC)    Hypertension    Past Surgical History:  Procedure Laterality Date   BACK SURGERY     CATARACT EXTRACTION Bilateral    Patient Active Problem List   Diagnosis Date Noted   Malignant neoplasm of upper-outer quadrant of left breast in female, estrogen receptor negative (HCC) 11/28/2024   Pain in finger of right hand 04/01/2018   Primary osteoarthritis of first carpometacarpal joint of right hand 04/01/2018    REFERRING PROVIDER: Dr. Donnice Bury  REFERRING DIAG: Left breast cancer  THERAPY DIAG:  Malignant neoplasm of upper-outer quadrant of left breast in female, estrogen receptor negative (HCC)  Abnormal posture  Rationale for Evaluation and Treatment: Rehabilitation  ONSET DATE: 10/13/2024  SUBJECTIVE:                                                                                                                                                                                           SUBJECTIVE STATEMENT: Patient reports she is here today to be seen by her medical team for her newly diagnosed left breast cancer.   PERTINENT HISTORY:  Patient was diagnosed on 10/13/2024 with left grade 2 invasive ductal carcinoma breast cancer. It measures 1.3 cm and is located in the upper outer quadrant. It is triple negative with a Ki67 of 2%.   PATIENT GOALS:   reduce lymphedema risk and learn post op HEP.   PAIN:   Are you having pain? No  PRECAUTIONS: Active CA   RED FLAGS: None   HAND DOMINANCE: right  WEIGHT BEARING RESTRICTIONS: No  FALLS:  Has patient fallen in last 6 months? Yes. Number of falls 1 - reports she fell in the bathroom when she woke early in the morning but said she doesn't notice any balance deficits  LIVING ENVIRONMENT: Patient lives with: her husband who was recently diagnosed with lymphoma Lives in: House/apartment Has following equipment at home: None  OCCUPATION: Retired  LEISURE: She does  not exercise  PRIOR LEVEL OF FUNCTION: Independent   OBJECTIVE: Note: Objective measures were completed at Evaluation unless otherwise noted.  COGNITION: Overall cognitive status: Within functional limits for tasks assessed    POSTURE:  Forward head and rounded shoulders posture  UPPER EXTREMITY AROM/PROM:  A/PROM RIGHT   eval   Shoulder extension 48  Shoulder flexion 117  Shoulder abduction 107  Shoulder internal rotation 72  Shoulder external rotation 77    (Blank rows = not tested)  A/PROM LEFT   eval  Shoulder extension 50  Shoulder flexion 117  Shoulder abduction 124  Shoulder internal rotation 58  Shoulder external rotation 80    (Blank rows = not tested)  CERVICAL AROM: All within normal limits:    Percent limited  Flexion WNL  Extension 50% limited  Right lateral flexion 50% limited  Left lateral flexion 50% limited  Right rotation 50% limited  Left rotation 50% limited    UPPER EXTREMITY STRENGTH: WFL  LYMPHEDEMA ASSESSMENTS (in cm):   LANDMARK RIGHT   eval  10 cm proximal to olecranon process from proximal aspect of olecranon 26.3  Olecranon process 23.3  10 cm proximal to ulnar styloid process from proximal aspect of styloid process 20.8  Just distal to ulnar styloid process 15.1  Across hand at thumb web space 18.7  At base of 2nd digit 6  (Blank rows = not tested)  LANDMARK LEFT   eval  10 cm proximal to olecranon  process from proximal aspect of olecranon 26.4   Olecranon process 23.3  10 cm proximal to ulnar styloid process from proximal aspect of styloid process 19.6  Just distal to ulnar styloid process 15  Across hand at thumb web space 18.5  At base of 2nd digit 5.8  (Blank rows = not tested)  L-DEX LYMPHEDEMA SCREENING:  The patient was assessed using the L-Dex machine today to produce a lymphedema index baseline score. The patient will be reassessed on a regular basis (typically every 3 months) to obtain new L-Dex scores. If the score is > 6.5 points away from his/her baseline score indicating onset of subclinical lymphedema, it will be recommended to wear a compression garment for 4 weeks, 12 hours per day and then be reassessed. If the score continues to be > 6.5 points from baseline at reassessment, we will initiate lymphedema treatment. Assessing in this manner has a 95% rate of preventing clinically significant lymphedema.   L-DEX FLOWSHEETS - 11/30/24 1500       L-DEX LYMPHEDEMA SCREENING   Measurement Type Unilateral    L-DEX MEASUREMENT EXTREMITY Upper Extremity    POSITION  Standing    DOMINANT SIDE Right    At Risk Side Left    BASELINE SCORE (UNILATERAL) 2.9          QUICK DASH SURVEY:  Amber [name] - 11/30/24 0001     Open a tight or new jar No difficulty    Do heavy household chores (wash walls, wash floors) Unable    Carry a shopping bag or briefcase No difficulty    Wash your back No difficulty    Use a knife to cut food No difficulty    Recreational activities in which you take some force or impact through your arm, shoulder, or hand (golf, hammering, tennis) Unable    During the past week, to what extent has your arm, shoulder or hand problem interfered with your normal social activities with family, friends, neighbors, or groups? Not at all  During the past week, to what extent has your arm, shoulder or hand problem limited your work or other regular daily  activities Not at all    Arm, shoulder, or hand pain. None    Tingling (pins and needles) in your arm, shoulder, or hand None    Difficulty Sleeping No difficulty    DASH Score 18.18 %           PATIENT EDUCATION:  Education details: Time spent educating patient on aspects of self-care to maximize post op recovery. Patient was educated on where and how to get a post op compression bra to use to reduce post op edema. Patient was also educated on the use of SOZO screenings and surveillance principles for early identification of lymphedema onset. She was instructed to use the post op pillow in the axilla for pressure and pain relief. Patient educated on lymphedema risk reduction and post op shoulder/posture HEP. Person educated: Patient Education method: Explanation, Demonstration, Handout Education comprehension: Patient verbalized understanding and returned demonstration  HOME EXERCISE PROGRAM: Patient was instructed today in a home exercise program today for post op shoulder range of motion. These included active assist shoulder flexion in sitting, scapular retraction, wall walking with shoulder abduction, and hands behind head external rotation.  She was encouraged to do these twice a day, holding 3 seconds and repeating 5 times when permitted by her physician.   ASSESSMENT:  CLINICAL IMPRESSION: Patient was diagnosed on 10/13/2024 with left grade 2 invasive ductal carcinoma breast cancer. It measures 1.3 cm and is located in the upper outer quadrant. It is triple negative with a Ki67 of 2%. Her multidisciplinary medical team met prior to her assessments to determine a recommended treatment plan. She is planning to have a left lumpectomy with a sentinel node biopsy followed by radiation. She will benefit from a post op PT reassessment to determine needs and from L-Dex screens every 3 months for 2 years to detect subclinical lymphedema.  Pt will benefit from skilled therapeutic intervention  to improve on the following deficits: Decreased knowledge of precautions, impaired UE functional use, pain, decreased ROM, postural dysfunction.   PT treatment/interventions: ADL/self-care home management, pt/family education, therapeutic exercise  REHAB POTENTIAL: Excellent  CLINICAL DECISION MAKING: Stable/uncomplicated  EVALUATION COMPLEXITY: Low   GOALS: Goals reviewed with patient? YES  LONG TERM GOALS: (STG=LTG)    Name Target Date Goal status  1 Pt will be able to verbalize understanding of pertinent lymphedema risk reduction practices relevant to her dx specifically related to skin care.  Baseline:  No knowledge 11/30/2024 Achieved at eval  2 Pt will be able to return demo and/or verbalize understanding of the post op HEP related to regaining shoulder ROM. Baseline:  No knowledge 11/30/2024 Achieved at eval  3 Pt will be able to verbalize understanding of the importance of viewing the post op After Breast CA Class video for further lymphedema risk reduction education and therapeutic exercise.  Baseline:  No knowledge 11/30/2024 Achieved at eval  4 Pt will demo she has regained full shoulder ROM and function post operatively compared to baselines.  Baseline: See objective measurements taken today. 01/25/2025     PLAN:  PT FREQUENCY/DURATION: EVAL and 1 follow up appointment.   PLAN FOR NEXT SESSION: will reassess 3-4 weeks post op to determine needs.   Patient will follow up at outpatient cancer rehab 3-4 weeks following surgery.  If the patient requires physical therapy at that time, a specific plan will be dictated and sent  to the referring physician for approval. The patient was educated today on appropriate basic range of motion exercises to begin post operatively and the importance of viewing the After Breast Cancer class video following surgery.  Patient was educated today on lymphedema risk reduction practices as it pertains to recommendations that will benefit the patient  immediately following surgery.  She verbalized good understanding.    Physical Therapy Information for After Breast Cancer Surgery/Treatment:  Lymphedema is a swelling condition that you may be at risk for in your arm if you have lymph nodes removed from the armpit area.  After a sentinel node biopsy, the risk is approximately 5-9% and is higher after an axillary node dissection.  There is treatment available for this condition and it is not life-threatening.  Contact your physician or physical therapist with concerns. You may begin the 4 shoulder/posture exercises (see additional sheet) when permitted by your physician (typically a week after surgery).  If you have drains, you may need to wait until those are removed before beginning range of motion exercises.  A general recommendation is to not lift your arms above shoulder height until drains are removed.  These exercises should be done to your tolerance and gently.  This is not a no pain/no gain type of recovery so listen to your body and stretch into the range of motion that you can tolerate, stopping if you have pain.  If you are having immediate reconstruction, ask your plastic surgeon about doing exercises as he or she may want you to wait. We encourage you to view the After Breast Cancer class video following surgery.  You will learn information related to lymphedema risk, prevention and treatment and additional exercises to regain mobility following surgery.   While undergoing any medical procedure or treatment, try to avoid blood pressure being taken or needle sticks from occurring on the arm on the side of cancer.   This recommendation begins after surgery and continues for the rest of your life.  This may help reduce your risk of getting lymphedema (swelling in your arm). An excellent resource for those seeking information on lymphedema is the National Lymphedema Network's web site. It can be accessed at www.lymphnet.org If you notice  swelling in your hand, arm or breast at any time following surgery (even if it is many years from now), please contact your doctor or physical therapist to discuss this.  Lymphedema can be treated at any time but it is easier for you if it is treated early on.  If you feel like your shoulder motion is not returning to normal in a reasonable amount of time, please contact your surgeon or physical therapist.  [facility] [phone]. [address], [place] [address]  [facility] CLASS After Breast Cancer Class  After Breast Cancer Class is a specially designed exercise class video to assist you in a safe recover after having breast cancer surgery.  In this video you will learn how to get back to full function whether your drains were just removed or if you had surgery a month ago. The video can be viewed on this page: https://www.boyd-meyer.org/ or on YouTube here: https://youtu.az/[email or web address].  Class Goals  Understand specific stretches to improve the flexibility of you chest and shoulder. Learn ways to safely strengthen your upper body and improve your posture. Understand the warning signs of infection and why you may be at risk for an arm infection. Learn about Lymphedema and prevention.  ** You  do not need to view this video until after surgery.  Drains should be removed to participate in the recommended exercises on the video.  Patient was instructed today in a home exercise program today for post op shoulder range of motion. These included active assist shoulder flexion in sitting, scapular retraction, wall walking with shoulder abduction, and hands behind head external rotation.  She was encouraged to do these twice a day, holding 3 seconds and repeating 5 times when permitted by her physician.  Eward Wonda Sharps, [place] 11/30/2024 3:59 PM   "

## 2024-11-30 NOTE — Progress Notes (Signed)
 REFERRING PROVIDER: Valentin Skates, DO [address] [place],  [place] [address]  PRIMARY PROVIDER:  Valentin Skates, DO  PRIMARY REASON FOR VISIT:  1. Malignant neoplasm of upper-outer quadrant of left breast in female, estrogen receptor negative (HCC)   2. Family history of malignant neoplasm of breast   3. Family history of malignant neoplasm of kidney     HISTORY OF PRESENT ILLNESS:   Amber [name], a 89 y.o. female, was seen for a cancer genetics consultation in [facility] due to a personal and family history of cancer.  Amber [name] presents to clinic today to discuss the possibility of a hereditary predisposition to cancer, to discuss genetic testing, and to further clarify her future cancer risks, as well as potential cancer risks for family members.   In 2026, at the age of 6, Amber [name] was diagnosed with left triple negative breast cancer. Planning for lumpectomy.    CANCER HISTORY:  Oncology History  Malignant neoplasm of upper-outer quadrant of left breast in female, estrogen receptor negative (HCC)  11/28/2024 Initial Diagnosis   Screening mammogram detected left breast mass UOQ 1.3 cm with spiculated margins, axilla negative, biopsy: Grade 2 IDC with DCIS intermediate grade, triple negative, Ki-67 2%   11/30/2024 Cancer Staging   Staging form: Breast, AJCC 8th Edition - Clinical: Stage IB (cT1c, cN0, cM0, G2, ER-, PR-, HER2-) - Signed by Odean Potts, MD on 11/30/2024 Stage prefix: Initial diagnosis Histologic grading system: 3 grade system      RELEVANT MEDICAL HISTORY:  Reports history of colonoscopy  Ovaries, uterus intact   No past medical history on file.  No past surgical history on file.  Social History   Socioeconomic History   Marital status: Married    Spouse name: Not on file   Number of children: Not on file   Years of education: Not on file   Highest education level: Not on file  Occupational History   Not on file  Tobacco Use   Smoking status: Never    Smokeless tobacco: Never  Substance and Sexual Activity   Alcohol  use: Not on file   Drug use: Not on file   Sexual activity: Not on file  Other Topics Concern   Not on file  Social History Narrative   Not on file   Social Drivers of Health   Tobacco Use: Low Risk (11/30/2024)   Patient History    Smoking Tobacco Use: Never    Smokeless Tobacco Use: Never    Passive Exposure: Not on file  Financial Resource Strain: Not on file  Food Insecurity: Not on file  Transportation Needs: Not on file  Physical Activity: Not on file  Stress: Not on file  Social Connections: Not on file  Depression (EYV7-0): Not on file  Alcohol  Screen: Not on file  Housing: Unknown (11/30/2024)   Received from [facility]   Epic    Unable to Pay for Housing in the Last Year: Not on file    Number of Times Moved in the Last Year: Not on file    At any time in the past 12 months, were you homeless or living in a shelter (including now)?: No  Utilities: Not on file  Health Literacy: Not on file     FAMILY HISTORY:  We obtained a detailed, 4-generation family history.  Significant diagnoses are listed below: Family History  Problem Relation Age of Onset   Breast cancer Mother 8   Breast cancer Sister 60 - 1  Bone cancer Brother    Kidney cancer [name] 44    Amber [name] is unaware of previous family history of genetic testing for hereditary cancer risks. There is no reported Ashkenazi Jewish ancestry.     GENETIC COUNSELING ASSESSMENT: Amber [name] is a 89 y.o. female with a personal and family history of cancer which is somewhat suggestive of a hereditary predisposition to cancer given her personal history of triple negative breast cancer, and family history of two first degree relatives diagnosed with breast cancer. We, therefore, discussed and recommended the following at today's visit.   DISCUSSION: We discussed that 5 - 10% of cancer is hereditary, with most cases of breast  associated with pathogenic variants in BRCA1/2.  There are other genes that can be associated with hereditary breast cancer syndromes.  We discussed that testing is beneficial for several reasons including knowing how to follow individuals after completing their treatment, identifying whether potential treatment options would be beneficial, and understanding if other family members could be at risk for cancer and allowing them to undergo genetic testing.   We reviewed the characteristics, features and inheritance patterns of hereditary cancer syndromes. We also discussed genetic testing, including the appropriate family members to test, the process of testing, insurance coverage and turn-around-time for results. We discussed the implications of a negative, positive, carrier and/or variant of uncertain significant result. We recommended Amber [name] pursue genetic testing for a panel that includes genes associated with breast cancer.   Amber [name]  was offered a common hereditary cancer panel (40+ genes) and an expanded pan-cancer panel (70+ genes). Amber [name] was informed of the benefits and limitations of each panel, including that expanded pan-cancer panels contain genes that do not have clear management guidelines at this point in time.  We also discussed that as the number of genes included on a panel increases, the chances of variants of uncertain significance increases. After considering the benefits and limitations of each gene panel, Amber [name] elected to have [facility]'s CancerNext-Expanded +RNAinsight panel. The CancerNext-Expanded gene panel offered by [facility] and includes sequencing, rearrangement, and RNA analysis for the following 77 genes: AIP, ALK, APC, ATM, AXIN2, BAP1, BARD1, BMPR1A, BRCA1, BRCA2, BRIP1, CDC73, CDH1, CDK4, CDKN1B, CDKN2A, CEBPA, CHEK2, CTNNA1, DDX41, DICER1, EGFR, EPCAM, ETV6, FH, FLCN, GATA2, GREM1, HOXB13, KIT, LZTR1, MAX, MBD4, MEN1, MET, MITF, MLH1, MSH2, MSH3, MSH6, MUTYH,  NF1, NF2, NTHL1, PALB2, PDGFRA, PHOX2B, PMS2, POLD1, POLE, POT1, PRKAR1A, PTCH1, PTEN, RAD51C, RAD51D, RB1, RET, RPS20, RUNX1, SDHA, SDHAF2, SDHB, SDHC, SDHD, SMAD4, SMARCA4, SMARCB1, SMARCE1, STK11, SUFU, TMEM127, TP53, TSC1, TSC2, VHL, WT1.   Based on Amber [name]'s personal and family history of cancer, she meets medical criteria for genetic testing. Despite that she meets criteria, she may still have an out of pocket cost. We discussed that if her out of pocket cost for testing is over $100, the laboratory should contact them to discuss self-pay prices, patient pay assistance programs, if applicable, and other billing options.  We discussed that some people do not want to undergo genetic testing due to fear of genetic discrimination.  A federal law called the Genetic Information Non-Discrimination Act (GINA) of 2008 helps protect individuals against genetic discrimination based on their genetic test results.  It impacts both health insurance and employment.  With health insurance, it protects against increased premiums, being kicked off insurance or being forced to take a test in order to be insured.  For employment it protects against hiring, firing and promoting decisions based on  genetic test results.  GINA does not apply to those in the [employer], those who work for companies with less than 15 employees, and new life insurance or long-term disability insurance policies.  Health status due to a cancer diagnosis is not protected under GINA.  PLAN: After considering the risks, benefits, and limitations, Amber [name] provided informed consent to pursue genetic testing and the blood sample was sent to [facility] for analysis of the CancerNext-Expanded +RNAinsight panel. Results should be available within approximately 2-3 weeks' time, at which point they will be disclosed by telephone to Amber [name], Amber [name] at [phone], as will any additional recommendations warranted by these results. Amber [name]  will receive a summary of her genetic counseling visit and a copy of her results once available. This information will also be available in Epic.   Lastly, we encouraged Amber [name] to remain in contact with cancer genetics annually so that we can continuously update the family history and inform her of any changes in cancer genetics and testing that may be of benefit for this family.   Amber [name] questions were answered to her satisfaction today. Our contact information was provided should additional questions or concerns arise. Thank you for the referral and allowing us  to share in the care of your patient.   Burnard Ogren, MS, [place] Licensed, [email or web address] phone: [phone]   20 minutes were spent on the date of the encounter in service to the patient including preparation, face-to-face consultation, documentation and care coordination. The patient brought her [name], Amber [name].    Drs. Gudena was available to discuss this case as needed.   _______________________________________________________________________ For Office Staff:  Number of people involved in session: 2 Was an Intern/ student involved with case: no

## 2024-11-30 NOTE — Progress Notes (Signed)
 [facility] CONSULT NOTE  Patient Care Team: Valentin Skates, DO as PCP - General (Internal Medicine) Tyree Nanetta SAILOR, RN as Oncology Nurse Navigator [name], MD as Consulting Physician (General Surgery) Odean Potts, MD as Consulting Physician (Hematology and Oncology) Dewey Rush, MD as Consulting Physician (Radiation Oncology)  CHIEF COMPLAINTS/PURPOSE OF CONSULTATION:  Newly diagnosed breast cancer  HISTORY OF PRESENTING ILLNESS:  Amber [name] is a 89 year old who had a screening mammogram detected left breast mass measuring 1.3 cm with spiculated margins.  Axilla was negative.  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  She was presented this morning to the multidiscipline tumor board and she is here today accompanied by her daughter to discuss her treatment plan.  I reviewed her records extensively and collaborated the history with the patient.  SUMMARY OF ONCOLOGIC HISTORY: Oncology History  Malignant neoplasm of upper-outer quadrant of left breast in female, estrogen receptor negative (HCC)  11/28/2024 Initial Diagnosis   Screening mammogram detected left breast mass UOQ 1.3 cm with spiculated margins, axilla negative, biopsy: Grade 2 IDC with DCIS intermediate grade, triple negative, Ki-67 2%   11/30/2024 Cancer Staging   Staging form: Breast, AJCC 8th Edition - Clinical: Stage IB (cT1c, cN0, cM0, G2, ER-, PR-, HER2-) - Signed by Odean Potts, MD on 11/30/2024 Stage prefix: Initial diagnosis Histologic grading system: 3 grade system      MEDICAL HISTORY:  No past medical history on file.  SURGICAL HISTORY: No past surgical history on file.  SOCIAL HISTORY: Social History   Socioeconomic History   Marital status: Married    Spouse name: Not on file   Number of children: Not on file   Years of education: Not on file   Highest education level: Not on file   Occupational History   Not on file  Tobacco Use   Smoking status: Never   Smokeless tobacco: Never  Substance and Sexual Activity   Alcohol  use: Not on file   Drug use: Not on file   Sexual activity: Not on file  Other Topics Concern   Not on file  Social History Narrative   Not on file   Social Drivers of Health   Tobacco Use: Not on file  Financial Resource Strain: Not on file  Food Insecurity: Not on file  Transportation Needs: Not on file  Physical Activity: Not on file  Stress: Not on file  Social Connections: Not on file  Intimate Partner Violence: Not on file  Depression (EYV7-0): Not on file  Alcohol  Screen: Not on file  Housing: Unknown (11/30/2024)   Received from [facility]   Epic    Unable to Pay for Housing in the Last Year: Not on file    Number of Times Moved in the Last Year: Not on file    At any time in the past 12 months, were you homeless or living in a shelter (including now)?: No  Utilities: Not on file  Health Literacy: Not on file    FAMILY HISTORY: No family history of breast cancer ALLERGIES:  has no allergies on file.  MEDICATIONS:  Current Outpatient Medications  Medication Sig Dispense Refill   hydrochlorothiazide (HYDRODIURIL) 12.5 MG tablet TAKE 1 TABLET BY MOUTH ONCE DAILY FOR BLOOD PRESSURE     latanoprost (XALATAN) 0.005 % ophthalmic solution INSTILL 1 DROP INTO BOTH EYES EVERY DAY AT BEDTIME  levothyroxine (SYNTHROID, LEVOTHROID) 50 MCG tablet Take 50 mcg by mouth daily before breakfast.     lisinopril (PRINIVIL,ZESTRIL) 20 MG tablet Take 20 mg by mouth daily.     lisinopril (ZESTRIL) 40 MG tablet Take 40 mg by mouth daily.     meloxicam  (MOBIC ) 15 MG tablet Take 1 tablet daily for 2 weeks.  If still in pain after 2 weeks, take 1 tablet daily for an additional 1 week. 30 tablet 0   metFORMIN (GLUCOPHAGE) 500 MG tablet Take by mouth 2 (two) times daily with a meal.     methocarbamol  (ROBAXIN ) 500 MG tablet Take 1  tablet (500 mg total) by mouth daily as needed for muscle spasms. 30 tablet 1   No current facility-administered medications for this visit.    REVIEW OF SYSTEMS:   Constitutional: Denies fevers, chills or abnormal night sweats  All other systems were reviewed with the patient and are negative.  PHYSICAL EXAMINATION: ECOG PERFORMANCE STATUS: 1 - Symptomatic but completely ambulatory  Vitals:   11/30/24 1305  BP: (!) 179/64  Pulse: 66  Resp: 18  Temp: 98.3 F (36.8 C)  SpO2: 98%   Filed Weights   11/30/24 1305  Weight: 145 lb 11.2 oz (66.1 kg)    GENERAL:alert, no distress and comfortable  LABORATORY DATA:  I have reviewed the data as listed Lab Results  Component Value Date   WBC 5.4 11/30/2024   HGB 13.4 11/30/2024   HCT 39.3 11/30/2024   MCV 85.1 11/30/2024   PLT 235 11/30/2024   Lab Results  Component Value Date   NA 139 11/30/2024   K 4.6 11/30/2024   CL 103 11/30/2024   CO2 27 11/30/2024    RADIOGRAPHIC STUDIES: I have personally reviewed the radiological reports and agreed with the findings in the report.  ASSESSMENT AND PLAN:  Malignant neoplasm of upper-outer quadrant of left breast in female, estrogen receptor negative (HCC) December 2025: Screening mammogram detected left breast mass UOQ 1.3 cm with spiculated margins, axilla negative, biopsy: Grade 2 IDC with DCIS intermediate grade, triple negative, Ki-67 2%  Pathology and radiology counseling: Discussed with the patient, the details of pathology including the type of breast cancer,the clinical staging, the significance of ER, PR and HER-2/neu receptors and the implications for treatment. After reviewing the pathology in detail, we proceeded to discuss the different treatment options between surgery, radiation, and antiestrogen therapies.  Treatment plan: Breast conserving surgery Given her advanced age I do not recommend adjuvant chemotherapy We will repeat breast prognostic panel on the final  pathology to confirm that it is estrogen  receptor negative. Plus or minus adjuvant radiation   All questions were answered. The patient knows to call the clinic with any problems, questions or concerns. I personally spent a total of 30 minutes in the care of the patient today including preparing to see the patient, getting/reviewing separately obtained history, performing a medically appropriate exam/evaluation, counseling and educating, placing orders, referring and communicating with other health care professionals, documenting clinical information in the EHR, independently interpreting results, communicating results, and coordinating care.   Amber K Nickolai Rinks, MD 11/30/2024

## 2024-11-30 NOTE — Research (Signed)
 [employer] 2021-05 - Specimen Collection Study to Evaluate Biomarkers in Subjects with Cancer    Patient Amber [name] was identified by Dr. Gudena as a potential candidate for the above listed study.  This Clinical Research Coordinator met with Amber [name], [id number], on 11/30/2024 in a manner and location that ensures patient privacy to discuss participation in the above listed research study.  Patient is Accompanied by daughter.  A copy of the informed consent document with embedded HIPAA language was provided to the patient.  Patient reads, speaks, and understands English.   Patient was provided with the business card of this Coordinator and encouraged to contact the research team with any questions.  Approximately 10 minutes were spent with the patient reviewing the informed consent documents.  Patient was provided the option of taking informed consent documents home to review and was encouraged to review at their convenience with their support network, including other care providers. Patient is comfortable with making a decision regarding study participation today. Patient declined, not interested. Dr. Odean notified.  Laury Quale, MPH  Clinical Research Coordinator

## 2024-12-01 ENCOUNTER — Encounter: Payer: Self-pay | Admitting: General Practice

## 2024-12-01 NOTE — Progress Notes (Signed)
 [facility] Multidisciplinary Clinic Spiritual Care Note  Left voicemail for [facility] following Breast Multidisciplinary Clinic to introduce Support Center team/resources.  She completed SDOH screening; results follow below.    SDOH Screenings   Food Insecurity: No Food Insecurity (12/01/2024)  Housing: Low Risk (12/01/2024)  Transportation Needs: No Transportation Needs (12/01/2024)  Utilities: Not At Risk (12/01/2024)  Depression (PHQ2-9): Low Risk (12/01/2024)  Tobacco Use: Low Risk (11/30/2024)    Chaplain and patient discussed common feelings and emotions when being diagnosed with cancer, and the importance of support during treatment.  Chaplain informed patient of the support team and support services at [facility].  Chaplain provided contact information and encouraged patient to call with any questions or concerns.  Follow up needed: Yes.  Plan to phone again if needed.   [address] Olam Corrigan, [place], [facility] Pager [phone] Voicemail [phone]

## 2024-12-02 ENCOUNTER — Encounter: Payer: Self-pay | Admitting: General Practice

## 2024-12-02 NOTE — Progress Notes (Signed)
 [facility] Spiritual Care Note  Left follow-up voicemail regarding Patient and Family Support team/resources, encouraging return call.  [address] Amber [name], [place], [facility] Pager [phone] Voicemail [phone]

## 2024-12-02 NOTE — Progress Notes (Signed)
 [facility] Spiritual Care Note  Spoke with daughter Rock Roca, whose number is primary for Ms Nygard. Ms Roca shared that there is additional family distress because Mr Hugill is having significant health concerns, as well. Ensured that Ms Roca is aware of Spiritual Care availability for the whole family as needed/desired. She and chaplain plan to follow up next week, after Mr Cerniglia has had a significant informational appointment, as well.  [address] Olam Corrigan, [place], [facility] Pager [phone] Voicemail [phone]

## 2024-12-08 ENCOUNTER — Encounter: Payer: Self-pay | Admitting: Internal Medicine

## 2024-12-09 ENCOUNTER — Telehealth: Payer: Self-pay | Admitting: Hematology and Oncology

## 2024-12-09 ENCOUNTER — Encounter: Payer: Self-pay | Admitting: *Deleted

## 2024-12-09 ENCOUNTER — Telehealth: Payer: Self-pay | Admitting: *Deleted

## 2024-12-09 DIAGNOSIS — C50412 Malignant neoplasm of upper-outer quadrant of left female breast: Secondary | ICD-10-CM

## 2024-12-09 NOTE — Telephone Encounter (Signed)
 left vm fot pt about scheduled appt date and time. Encouraged to call back if need to reschedule

## 2024-12-09 NOTE — Telephone Encounter (Signed)
 Spoke with patient to follow up from [facility] 1/7 and assess navigation needs.  Patient denies any questions or concerns at this time.  Encouraged her to call should anything arise. Patient verbalized understanding.

## 2024-12-12 NOTE — Pre-Procedure Instructions (Signed)
 Surgical Instructions   Your procedure is scheduled on December 15, 2024. Report to [facility] Main Entrance A at 8:10 A.M., then check in with the Admitting office. Any questions or running late day of surgery: call [phone]  Questions prior to your surgery date: call [phone], Monday-Friday, 8am-4pm. If you experience any cold or flu symptoms such as cough, fever, chills, shortness of breath, etc. between now and your scheduled surgery, please notify us  at the above number.     Remember:  Do not eat after midnight the night before your surgery   You may drink clear liquids until 7:10 AM the morning of your surgery.   Clear liquids allowed are: Water, Non-Citrus Juices (without pulp), Carbonated Beverages, Clear Tea (no milk, honey, etc.), Black Coffee Only (NO MILK, CREAM OR POWDERED CREAMER of any kind), and Gatorade.  Patient Instructions  The night before surgery:  No food after midnight. ONLY clear liquids after midnight   The day of surgery (if you have diabetes): Drink ONE (1) 12 oz G2 given to you in your pre admission testing appointment by 7:10 AM the morning of surgery. Drink in one sitting. Do not sip.  This drink was given to you during your hospital  pre-op appointment visit.  Nothing else to drink after completing the  12 oz bottle of G2.         If you have questions, please contact your surgeons office.    Take these medicines the morning of surgery with A SIP OF WATER: amLODipine (NORVASC)  dorzolamide-timolol (COSOPT) ophthalmic solution  levothyroxine (SYNTHROID)  rosuvastatin (CRESTOR)    May take these medicines IF NEEDED: gabapentin (NEURONTIN)    One week prior to surgery, STOP taking any Aspirin (unless otherwise instructed by your surgeon) Aleve, Naproxen, Ibuprofen, Motrin, Advil, Goody's, BC's, all herbal medications, fish oil, and non-prescription vitamins.   WHAT DO I DO ABOUT MY DIABETES MEDICATION?   Do not take metFORMIN  (GLUCOPHAGE-XR) the morning of surgery.   HOW TO MANAGE YOUR DIABETES BEFORE AND AFTER SURGERY  Why is it important to control my blood sugar before and after surgery? Improving blood sugar levels before and after surgery helps healing and can limit problems. A way of improving blood sugar control is eating a healthy diet by:  Eating less sugar and carbohydrates  Increasing activity/exercise  Talking with your doctor about reaching your blood sugar goals High blood sugars (greater than 180 mg/dL) can raise your risk of infections and slow your recovery, so you will need to focus on controlling your diabetes during the weeks before surgery. Make sure that the doctor who takes care of your diabetes knows about your planned surgery including the date and location.  How do I manage my blood sugar before surgery? Check your blood sugar at least 4 times a day, starting 2 days before surgery, to make sure that the level is not too high or low.  Check your blood sugar the morning of your surgery when you wake up and every 2 hours until you get to the Short Stay unit.  If your blood sugar is less than 70 mg/dL, you will need to treat for low blood sugar: Do not take insulin . Treat a low blood sugar (less than 70 mg/dL) with  cup of clear juice (cranberry or apple), 4 glucose tablets, OR glucose gel. Recheck blood sugar in 15 minutes after treatment (to make sure it is greater than 70 mg/dL). If your blood sugar is not greater than  70 mg/dL on recheck, call [phone] for further instructions. Report your blood sugar to the short stay nurse when you get to Short Stay.  If you are admitted to the hospital after surgery: Your blood sugar will be checked by the staff and you will probably be given insulin  after surgery (instead of oral diabetes medicines) to make sure you have good blood sugar levels. The goal for blood sugar control after surgery is 80-180 mg/dL.                      Do NOT  Smoke (Tobacco/Vaping) for 24 hours prior to your procedure.  If you use a CPAP at night, you may bring your mask/headgear for your overnight stay.   You will be asked to remove any contacts, glasses, piercing's, hearing aid's, dentures/partials prior to surgery. Please bring cases for these items if needed.    Your surgeon will determine if you are to be admitted or discharged the same day.  Patients discharged the day of surgery will not be allowed to drive home, and someone needs to stay with them for 24 hours.  SURGICAL WAITING ROOM VISITATION Patients may have no more than 2 support people in the waiting area - these visitors may rotate.   Pre-op nurse will coordinate an appropriate time for 2 ADULT support persons, who may not rotate, to accompany patient in pre-op.  Children under the age of 46 must have an adult with them who is not the patient and must remain in the main waiting area with an adult.  If the patient needs to stay at the hospital during part of their recovery, the visitor guidelines for inpatient rooms apply.  Please refer to the [facility] website for the visitor guidelines for any additional information.   If you received a COVID test during your pre-op visit  it is requested that you wear a mask when out in public, stay away from anyone that may not be feeling well and notify your surgeon if you develop symptoms. If you have been in contact with anyone that has tested positive in the last 10 days please notify you surgeon.      Pre-operative CHG Bathing Instructions   You can play a key role in reducing the risk of infection after surgery. Your skin needs to be as free of germs as possible. You can reduce the number of germs on your skin by washing with CHG (chlorhexidine  gluconate) soap before surgery. CHG is an antiseptic soap that kills germs and continues to kill germs even after washing.   DO NOT use if you have an allergy to chlorhexidine /CHG or  antibacterial soaps. If your skin becomes reddened or irritated, stop using the CHG and notify one of our RNs at [phone].              TAKE A SHOWER THE NIGHT BEFORE SURGERY   Please keep in mind the following:  DO NOT shave, including legs and underarms, 48 hours prior to surgery.   You may shave your face before/day of surgery.  Place clean sheets on your bed the night before surgery Use a clean washcloth (not used since being washed) for shower. DO NOT sleep with pet's night before surgery.  CHG Shower Instructions:  Wash your face and private area with normal soap. If you choose to wash your hair, wash first with your normal shampoo.  After you use shampoo/soap, rinse your hair and body thoroughly to remove shampoo/soap  residue.  Turn the water OFF and apply half the bottle of CHG soap to a CLEAN washcloth.  Apply CHG soap ONLY FROM YOUR NECK DOWN TO YOUR TOES (washing for 3-5 minutes)  DO NOT use CHG soap on face, private areas, open wounds, or sores.  Pay special attention to the area where your surgery is being performed.  If you are having back surgery, having someone wash your back for you may be helpful. Wait 2 minutes after CHG soap is applied, then you may rinse off the CHG soap.  Pat dry with a clean towel  Put on clean pajamas    Additional instructions for the day of surgery: If you choose, you may shower the morning of surgery with an antibacterial soap.  DO NOT APPLY any lotions, deodorants, cologne, or perfumes.   Do not wear jewelry or makeup Do not wear nail polish, gel polish, artificial nails, or any other type of covering on natural nails (fingers and toes) Do not bring valuables to the hospital. [facility] is not responsible for valuables/personal belongings. Put on clean/comfortable clothes.  Please brush your teeth.  Ask your nurse before applying any prescription medications to the skin.

## 2024-12-13 ENCOUNTER — Telehealth: Payer: Self-pay | Admitting: Genetic Counselor

## 2024-12-13 ENCOUNTER — Encounter (HOSPITAL_COMMUNITY): Payer: Self-pay

## 2024-12-13 ENCOUNTER — Encounter (HOSPITAL_COMMUNITY)
Admission: RE | Admit: 2024-12-13 | Discharge: 2024-12-13 | Disposition: A | Discharge status: Home / Self Care | Source: Ambulatory Visit | Attending: General Surgery | Admitting: General Surgery

## 2024-12-13 ENCOUNTER — Other Ambulatory Visit: Payer: Self-pay

## 2024-12-13 VITALS — BP 146/63 | HR 74 | Temp 97.7°F | Resp 17 | Ht 63.0 in | Wt 145.0 lb

## 2024-12-13 DIAGNOSIS — E119 Type 2 diabetes mellitus without complications: Secondary | ICD-10-CM | POA: Insufficient documentation

## 2024-12-13 DIAGNOSIS — Z79890 Hormone replacement therapy (postmenopausal): Secondary | ICD-10-CM | POA: Diagnosis not present

## 2024-12-13 DIAGNOSIS — Z7984 Long term (current) use of oral hypoglycemic drugs: Secondary | ICD-10-CM | POA: Diagnosis not present

## 2024-12-13 DIAGNOSIS — Z01812 Encounter for preprocedural laboratory examination: Secondary | ICD-10-CM | POA: Diagnosis present

## 2024-12-13 DIAGNOSIS — I10 Essential (primary) hypertension: Secondary | ICD-10-CM | POA: Diagnosis not present

## 2024-12-13 DIAGNOSIS — E039 Hypothyroidism, unspecified: Secondary | ICD-10-CM | POA: Diagnosis not present

## 2024-12-13 DIAGNOSIS — M810 Age-related osteoporosis without current pathological fracture: Secondary | ICD-10-CM | POA: Diagnosis not present

## 2024-12-13 DIAGNOSIS — Z01810 Encounter for preprocedural cardiovascular examination: Secondary | ICD-10-CM | POA: Diagnosis present

## 2024-12-13 DIAGNOSIS — C50912 Malignant neoplasm of unspecified site of left female breast: Secondary | ICD-10-CM | POA: Diagnosis not present

## 2024-12-13 DIAGNOSIS — Z01818 Encounter for other preprocedural examination: Secondary | ICD-10-CM | POA: Insufficient documentation

## 2024-12-13 HISTORY — DX: Hypothyroidism, unspecified: E03.9

## 2024-12-13 HISTORY — DX: Other specified postprocedural states: R11.2

## 2024-12-13 HISTORY — DX: Cardiac murmur, unspecified: R01.1

## 2024-12-13 HISTORY — DX: Malignant (primary) neoplasm, unspecified: C80.1

## 2024-12-13 LAB — GLUCOSE, CAPILLARY: Glucose-Capillary: 245 mg/dL — ABNORMAL HIGH (ref 70–99)

## 2024-12-13 LAB — HEMOGLOBIN A1C
Hgb A1c MFr Bld: 6.8 % — ABNORMAL HIGH (ref 4.8–5.6)
Mean Plasma Glucose: 148.46 mg/dL

## 2024-12-13 NOTE — Anesthesia Preprocedure Evaluation (Signed)
 "                                  Anesthesia Evaluation  Patient identified by MRN, date of birth, ID band Patient awake    Reviewed: Allergy & Precautions, NPO status , Patient's Chart, lab work & pertinent test results  History of Anesthesia Complications (+) PONV and history of anesthetic complications  Airway Mallampati: II  TM Distance: >3 FB Neck ROM: Full    Dental no notable dental hx.    Pulmonary neg pulmonary ROS   Pulmonary exam normal        Cardiovascular hypertension, Pt. on medications  Rhythm:Regular Rate:Normal     Neuro/Psych negative neurological ROS  negative psych ROS   GI/Hepatic negative GI ROS, Neg liver ROS,,,  Endo/Other  diabetes, Type 2, Oral Hypoglycemic AgentsHypothyroidism    Renal/GU negative Renal ROS  negative genitourinary   Musculoskeletal  (+) Arthritis , Osteoarthritis,    Abdominal Normal abdominal exam  (+)   Peds  Hematology Lab Results      Component                Value               Date                      WBC                      5.4                 11/30/2024                HGB                      13.4                11/30/2024                HCT                      39.3                11/30/2024                MCV                      85.1                11/30/2024                PLT                      235                 11/30/2024             Lab Results      Component                Value               Date                      NA                       139  Anesthesia Evaluation    Airway        Dental   Pulmonary           Cardiovascular hypertension,      Neuro/Psych    GI/Hepatic   Endo/Other  diabetes    Renal/GU      Musculoskeletal   Abdominal   Peds  Hematology   Anesthesia Other Findings   Reproductive/Obstetrics                              Anesthesia Physical Anesthesia Plan  ASA:   Anesthesia Plan:    Post-op Pain Management:    Induction:   PONV Risk Score and Plan:   Airway Management Planned:   Additional Equipment:   Intra-op Plan:   Post-operative Plan:   Informed Consent:   Plan Discussed with:   Anesthesia Plan Comments: (PAT note written 12/13/2024 by Levora Werden, PA-C.  )        Anesthesia Quick Evaluation  "

## 2024-12-13 NOTE — Progress Notes (Addendum)
 PCP - Massie Sewer Cardiologist - denies  PPM/ICD - denies  Chest x-ray - denies EKG - 12/13/2024 Stress Test - denies ECHO - denies Cardiac Cath - denies  Sleep Study - denies  Fasting Blood Sugar - 140-200 Checks Blood Sugar 2 times a day  Last dose of GLP1 agonist-  denies   Blood Thinner Instructions: denies Aspirin Instructions: denies; took advil 12/13/24 at 0200, called surgeon's office to notify, they said that was fine.   ERAS Protcol - clear liquids until 7:10 PRE-SURGERY G2-   COVID TEST- NA   Anesthesia review: yes, breast seed; pt reports heart murmur but no cardiac testing available. [name], anesthesia PA to assess pt and EKG tracing in PAT.   Patient denies shortness of breath, fever, cough and chest pain at PAT appointment. Denies any respiratory symptoms in the past two weeks.    All instructions explained to the patient, with a verbal understanding of the material. Patient agrees to go over the instructions while at home for a better understanding. The opportunity to ask questions was provided.

## 2024-12-13 NOTE — Telephone Encounter (Signed)
 LVM asking for call back to review results of genetic testing.

## 2024-12-13 NOTE — Progress Notes (Signed)
 Anesthesia APP PAT Evaluation:  Case: [id number] Date/Time: 12/15/24 0955   Procedures:      LUMPECTOMY WITH MAGNETIC MARKER LOCALIZATION (Left: Breast)     BIOPSY, LYMPH NODE, SENTINEL, AXILLARY (Left) - LEFT BREAST SEED-GUIDED LUMPECTOMY LEFT AXILLARY SENTINEL NODE BIOPSY GEN w/PEC BLOCK   Anesthesia type: General   Pre-op diagnosis: LEFT BREAST CANCER   [facility]: [facility]OM 09 / [facility]   Surgeons: [name], MD       DISCUSSION: Patient is an 89 year old female scheduled for the above procedure. Daughter present during PAT visit. Patient is somewhat hard of hearing.   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   Patient reported a remote history of being told she had a murmur. She denied any history of echo, referral to cardiology, or activity restrictions. She is followed regularly at [facility] No murmur documented at 08/16/2024 NP follow-up. She denied chest pain, SOB, edema, syncope, orthopnea. She is able to do her ADLs independently. She sometimes uses a walker, but says she can walk about a store. I did not appreciate a murmur on exam. Rhythm, rate regular. Lung clear. No carotid bruit noted. No significant ankle edema. Good mouth opening. EKG showed NSR.   A1c 6.8%. CBC and CMP at [facility] done on 11/30/2024. Total bili 1.3 (1.4 on 08/16/2024).   She is scheduled for Mag seed placement on 12/13/2024. She has had significant issues with post-operative N/V, but said she did better with her last back surgery in 01/19/2008 (medication administration records uploaded in to [facility] lists Decadron  10 mg IV, Zofran  4 mg IV, Scopolamine patch, Reglan 10 mg, but unclear what was given pre versus post-operatively). Anesthesia team to evaluate on the day of surgery.   VS: BP (!) 146/63   Pulse  74   Temp 36.5 C   Resp 17   Ht 5' 3 (1.6 m)   Wt 65.8 kg   SpO2 98%   BMI 25.69 kg/m  Provider wore face mask. See DISCUSSION.   PROVIDERS: Valentin Skates, DO is PCP. Last visit noted (see Care Everywhere) was with NP on 08/16/2024 for HTN follow-up after medication adjustment.   LABS: Most recent lab results in [facility] include: Lab Results  Component Value Date   WBC 5.4 11/30/2024   HGB 13.4 11/30/2024   HCT 39.3 11/30/2024   PLT 235 11/30/2024   GLUCOSE 138 (H) 11/30/2024   ALT 14 11/30/2024   AST 21 11/30/2024   NA 139 11/30/2024   K 4.6 11/30/2024   CL 103 11/30/2024   CREATININE 0.92 11/30/2024   BUN 17 11/30/2024   CO2 27 11/30/2024   INR 0.9 01/19/2008   HGBA1C 6.8 (H) 12/13/2024     LFTs normal 08/16/2024 (TBili 1.4, AST 15, ALT 14). A1c 6.9% 05/06/2024.   IMAGES: US  Thyroid  06/19/2020: - Moderate thyroid  heterogeneity. There are several scattered right mid and lower thyroid  subcentimeter cystic and hypoechoic nodules noted all measuring 8 mm or less in size. There is also a right inferior thyroid  8 mm dystrophic calcification which correlates with the CT finding. None of these nodules meet criteria for follow-up or biopsy. - No hypervascularity or regional adenopathy. IMPRESSION: Nonspecific thyroid  heterogeneity and right thyroid  subcentimeter nodules and dystrophic calcification. These findings correlate with the CT finding. See above comment. - The above is in keeping with the ACR TI-RADS recommendations - J  Am Coll Radiol 2017;14:587-595.    EKG: 12/13/2024: NSR   CV: N/A  Past Medical History:  Diagnosis Date   Cancer (HCC)    Diabetes (HCC)    Heart murmur    Hypertension    Hypothyroidism    PONV (postoperative nausea and vomiting)     Past Surgical History:  Procedure Laterality Date   ANKLE FRACTURE SURGERY  1982   BACK SURGERY     x2   CATARACT EXTRACTION Bilateral     MEDICATIONS:  amLODipine (NORVASC) 5 MG tablet   calcium  carbonate (TUMS - DOSED IN MG ELEMENTAL CALCIUM) 500 MG chewable tablet   Cholecalciferol (VITAMIN D3 PO)   Cyanocobalamin  (VITAMIN B-12 PO)   dorzolamide-timolol (COSOPT) 2-0.5 % ophthalmic solution   gabapentin (NEURONTIN) 100 MG capsule   latanoprost (XALATAN) 0.005 % ophthalmic solution   levothyroxine (SYNTHROID) 25 MCG tablet   lisinopril (ZESTRIL) 40 MG tablet   metFORMIN (GLUCOPHAGE-XR) 500 MG 24 hr tablet   rosuvastatin (CRESTOR) 10 MG tablet   No current facility-administered medications for this encounter.    Isaiah Ruder, PA-C Surgical Short Stay/Anesthesiology [facility] Phone [phone] [facility] Phone [phone] 12/13/2024 2:18 PM

## 2024-12-14 NOTE — H&P (Signed)
 " 54 yof with mass noted in left UOQ. She has family history in her mom at age 89. On MM this is 1.3 cm. US  is 1.3x0.9x1 cm mass. Axillary us  is negative. 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  She is retired and geologist, engineering and fairly active  Review of Systems: A complete review of systems was obtained from the patient. I have reviewed this information and discussed as appropriate with the patient. See HPI as well for other ROS.  Review of Systems  Musculoskeletal: Positive for back pain.  All other systems reviewed and are negative.  Medical History: Past Medical History:  Diagnosis Date  Arthritis  Diabetes mellitus without complication (CMS/HHS-HCC)  Hyperlipidemia  Hypertension  Thyroid  disease   Patient Active Problem List  Diagnosis  Malignant neoplasm of upper-outer quadrant of left breast in female, estrogen receptor negative (CMS/HHS-HCC)   Past Surgical History:  Procedure Laterality Date  SPINE SURGERY   No Known Allergies  Current Outpatient Medications on File Prior to Visit  Medication Sig Dispense Refill  hydroCHLOROthiazide (HYDRODIURIL) 12.5 MG tablet Take 12.5 mg by mouth once daily  latanoprost (XALATAN) 0.005 % ophthalmic solution INSTILL 1 DROP INTO EACH EYE IN THE EVENING  levothyroxine (SYNTHROID) 25 MCG tablet TAKE 1 TABLET BY MOUTH ONCE DAILY (CAN TAKE WITH OTHER MEDICINES IF TAKEN CONSISTENTLY THAT WAY) NEED TSH FOR REFILLS  lisinopriL (ZESTRIL) 40 MG tablet Take 40 mg by mouth once daily  meloxicam  (MOBIC ) 15 MG tablet Take 1 tablet daily for 2 weeks. If still in pain after 2 weeks, take 1 tablet daily for an additional 1 week.  metFORMIN (GLUCOPHAGE) 500 MG tablet Take 500 mg by mouth 2 (two) times daily  methocarbamoL  (ROBAXIN ) 500 MG tablet Take 500 mg by mouth   No current facility-administered medications on file prior to visit.   Family History  Problem Relation Age of Onset  Diabetes Mother  Breast cancer Mother  Breast cancer Sister   Heart valve disease Brother   Social History   Tobacco Use  Smoking Status Not on file  Smokeless Tobacco Not on file  Marital status: Married    Objective:   Physical Exam Vitals reviewed.  Constitutional:  Appearance: Normal appearance.  Chest:  Breasts: Right: No inverted nipple, mass or nipple discharge.  Left: No inverted nipple, mass or nipple discharge.  Lymphadenopathy:  Upper Body:  Right upper body: No supraclavicular or axillary adenopathy.  Left upper body: No supraclavicular or axillary adenopathy.  Neurological:  Mental Status: She is alert.    Assessment and Plan:   Malignant neoplasm of upper-outer quadrant of left breast in female, estrogen receptor negative (CMS/HHS-HCC)  Left breast seed guided lumpectomy, left axillary sn biopsy  We discussed the staging and pathophysiology of breast cancer. We discussed all of the different options for treatment for breast cancer including surgery, chemotherapy, radiation therapy, Herceptin, and antiestrogen therapy.  We discussed a sentinel lymph node biopsy as she does not appear to having lymph node involvement right now. We discussed the performance of that with injection of radioactive tracer. We discussed that there is a chance of having a positive node with a sentinel lymph node biopsy and we will await the permanent pathology to make any other first further decisions in terms of her treatment. We discussed up to a 5% risk lifetime of chronic shoulder pain as well as lymphedema associated with a sentinel lymph node biopsy. I think would be ok to omit as does med onc.  Rad onc has requested a sentinel node.   We discussed the options for treatment of the breast cancer which included lumpectomy versus a mastectomy. We discussed the performance of the lumpectomy with radioactive seed placement. We discussed a 5-10% chance of a positive margin requiring reexcision in the operating room. We also discussed that she will  likely need radiation therapy if she undergoes lumpectomy. We discussed mastectomy and the postoperative care for that as well. Mastectomy can be followed by reconstruction. The decision for lumpectomy vs mastectomy has no impact on decision for chemotherapy. Most mastectomy patients will not need radiation therapy. We discussed that there is no difference in her survival whether she undergoes lumpectomy with radiation therapy or antiestrogen therapy versus a mastectomy. There is also no real difference between her recurrence in the breast.  We discussed the risks of operation including bleeding, infection, possible reoperation. She understands her further therapy will be based on what her stages at the time of her operation.  "

## 2024-12-15 ENCOUNTER — Ambulatory Visit (HOSPITAL_COMMUNITY): Admitting: Anesthesiology

## 2024-12-15 ENCOUNTER — Encounter (HOSPITAL_COMMUNITY)
Admission: RE | Disposition: A | Discharge status: Home / Self Care | Payer: Self-pay | Source: Home / Self Care | Attending: General Surgery

## 2024-12-15 ENCOUNTER — Ambulatory Visit (HOSPITAL_COMMUNITY)
Admission: RE | Admit: 2024-12-15 | Discharge: 2024-12-15 | Disposition: A | Discharge status: Home / Self Care | Attending: General Surgery | Admitting: General Surgery

## 2024-12-15 ENCOUNTER — Encounter (HOSPITAL_COMMUNITY): Payer: Self-pay | Admitting: General Surgery

## 2024-12-15 ENCOUNTER — Ambulatory Visit (HOSPITAL_COMMUNITY): Payer: Self-pay | Admitting: Vascular Surgery

## 2024-12-15 ENCOUNTER — Other Ambulatory Visit: Payer: Self-pay

## 2024-12-15 DIAGNOSIS — Z1722 Progesterone receptor negative status: Secondary | ICD-10-CM | POA: Insufficient documentation

## 2024-12-15 DIAGNOSIS — Z803 Family history of malignant neoplasm of breast: Secondary | ICD-10-CM | POA: Diagnosis not present

## 2024-12-15 DIAGNOSIS — C50912 Malignant neoplasm of unspecified site of left female breast: Secondary | ICD-10-CM

## 2024-12-15 DIAGNOSIS — E039 Hypothyroidism, unspecified: Secondary | ICD-10-CM | POA: Diagnosis not present

## 2024-12-15 DIAGNOSIS — Z7984 Long term (current) use of oral hypoglycemic drugs: Secondary | ICD-10-CM | POA: Diagnosis not present

## 2024-12-15 DIAGNOSIS — M1990 Unspecified osteoarthritis, unspecified site: Secondary | ICD-10-CM | POA: Insufficient documentation

## 2024-12-15 DIAGNOSIS — I10 Essential (primary) hypertension: Secondary | ICD-10-CM | POA: Diagnosis not present

## 2024-12-15 DIAGNOSIS — C50412 Malignant neoplasm of upper-outer quadrant of left female breast: Secondary | ICD-10-CM | POA: Insufficient documentation

## 2024-12-15 DIAGNOSIS — Z171 Estrogen receptor negative status [ER-]: Secondary | ICD-10-CM | POA: Diagnosis not present

## 2024-12-15 DIAGNOSIS — Z17421 Hormone receptor negative with human epidermal growth factor receptor 2 negative status: Secondary | ICD-10-CM | POA: Insufficient documentation

## 2024-12-15 DIAGNOSIS — Z01818 Encounter for other preprocedural examination: Secondary | ICD-10-CM

## 2024-12-15 DIAGNOSIS — E119 Type 2 diabetes mellitus without complications: Secondary | ICD-10-CM | POA: Diagnosis not present

## 2024-12-15 LAB — GLUCOSE, CAPILLARY
Glucose-Capillary: 150 mg/dL — ABNORMAL HIGH (ref 70–99)
Glucose-Capillary: 175 mg/dL — ABNORMAL HIGH (ref 70–99)

## 2024-12-15 MED ORDER — MAGTRACE LYMPHATIC TRACER
INTRAMUSCULAR | Status: DC | PRN
  Administered 2024-12-15: 1 mL via INTRAMUSCULAR

## 2024-12-15 MED ORDER — CHLORHEXIDINE GLUCONATE CLOTH 2 % EX PADS: 6.0000 | MEDICATED_PAD | Freq: Once | CUTANEOUS | Status: DC

## 2024-12-15 MED ORDER — ENSURE PRE-SURGERY PO LIQD: 296.0000 mL | Freq: Once | ORAL | Status: DC

## 2024-12-15 MED ORDER — LIDOCAINE 2% (20 MG/ML) 5 ML SYRINGE
INTRAMUSCULAR | Status: DC | PRN
  Administered 2024-12-15: 40 mg via INTRAVENOUS

## 2024-12-15 MED ORDER — DEXAMETHASONE SOD PHOSPHATE PF 10 MG/ML IJ SOLN
INTRAMUSCULAR | Status: DC | PRN
  Administered 2024-12-15: 10 mg via INTRAVENOUS

## 2024-12-15 MED ORDER — PROPOFOL 10 MG/ML IV BOLUS
INTRAVENOUS | Status: AC
  Filled 2024-12-15: qty 20

## 2024-12-15 MED ORDER — INSULIN ASPART 100 UNIT/ML IJ SOLN: 0.0000 [IU] | INTRAMUSCULAR | Status: DC | PRN

## 2024-12-15 MED ORDER — ONDANSETRON HCL 4 MG/2ML IJ SOLN: 4.0000 mg | Freq: Once | INTRAMUSCULAR | Status: AC

## 2024-12-15 MED ORDER — CEFAZOLIN SODIUM-DEXTROSE 2-4 GM/100ML-% IV SOLN
2.0000 g | INTRAVENOUS | Status: AC
  Administered 2024-12-15: 2 g via INTRAVENOUS
  Filled 2024-12-15: qty 100

## 2024-12-15 MED ORDER — ORAL CARE MOUTH RINSE: 15.0000 mL | Freq: Once | OROMUCOSAL | Status: AC

## 2024-12-15 MED ORDER — FENTANYL CITRATE (PF) 100 MCG/2ML IJ SOLN
INTRAMUSCULAR | Status: AC
  Administered 2024-12-15: 50 ug via INTRAVENOUS
  Filled 2024-12-15: qty 2

## 2024-12-15 MED ORDER — SUCCINYLCHOLINE CHLORIDE 200 MG/10ML IV SOSY
PREFILLED_SYRINGE | INTRAVENOUS | Status: DC | PRN
  Administered 2024-12-15: 100 mg via INTRAVENOUS

## 2024-12-15 MED ORDER — PROPOFOL 10 MG/ML IV BOLUS
INTRAVENOUS | Status: DC | PRN
  Administered 2024-12-15: 120 mg via INTRAVENOUS
  Administered 2024-12-15: 50 mg via INTRAVENOUS
  Administered 2024-12-15: 125 ug/kg/min via INTRAVENOUS
  Administered 2024-12-15: 40 mg via INTRAVENOUS

## 2024-12-15 MED ORDER — DROPERIDOL 2.5 MG/ML IJ SOLN: 0.6250 mg | Freq: Once | INTRAMUSCULAR | Status: DC | PRN

## 2024-12-15 MED ORDER — FENTANYL CITRATE (PF) 100 MCG/2ML IJ SOLN: 50.0000 ug | Freq: Once | INTRAMUSCULAR | Status: AC

## 2024-12-15 MED ORDER — PHENYLEPHRINE 80 MCG/ML (10ML) SYRINGE FOR IV PUSH (FOR BLOOD PRESSURE SUPPORT)
PREFILLED_SYRINGE | INTRAVENOUS | Status: DC | PRN
  Administered 2024-12-15: 160 ug via INTRAVENOUS

## 2024-12-15 MED ORDER — BUPIVACAINE-EPINEPHRINE 0.25% -1:200000 IJ SOLN
INTRAMUSCULAR | Status: DC | PRN
  Administered 2024-12-15: 6 mL

## 2024-12-15 MED ORDER — PHENYLEPHRINE HCL-NACL 20-0.9 MG/250ML-% IV SOLN
INTRAVENOUS | Status: DC | PRN
  Administered 2024-12-15: 40 ug/min via INTRAVENOUS

## 2024-12-15 MED ORDER — ROPIVACAINE HCL 5 MG/ML IJ SOLN
INTRAMUSCULAR | Status: DC | PRN
  Administered 2024-12-15: 30 mL

## 2024-12-15 MED ORDER — FENTANYL CITRATE (PF) 100 MCG/2ML IJ SOLN
25.0000 ug | INTRAMUSCULAR | Status: DC | PRN
  Administered 2024-12-15: 25 ug via INTRAVENOUS

## 2024-12-15 MED ORDER — DEXAMETHASONE SOD PHOSPHATE PF 10 MG/ML IJ SOLN
INTRAMUSCULAR | Status: DC | PRN
  Administered 2024-12-15: 10 mg

## 2024-12-15 MED ORDER — CHLORHEXIDINE GLUCONATE 0.12 % MT SOLN
15.0000 mL | Freq: Once | OROMUCOSAL | Status: AC
  Administered 2024-12-15: 15 mL via OROMUCOSAL
  Filled 2024-12-15: qty 15

## 2024-12-15 MED ORDER — TRAMADOL HCL 50 MG PO TABS: 50.0000 mg | ORAL_TABLET | Freq: Four times a day (QID) | ORAL | 0 refills | Status: AC | PRN

## 2024-12-15 MED ORDER — ONDANSETRON HCL 4 MG PO TABS: 4.0000 mg | ORAL_TABLET | Freq: Three times a day (TID) | ORAL | 0 refills | Status: AC | PRN

## 2024-12-15 MED ORDER — FENTANYL CITRATE (PF) 100 MCG/2ML IJ SOLN
INTRAMUSCULAR | Status: AC
  Filled 2024-12-15: qty 2

## 2024-12-15 MED ORDER — ONDANSETRON HCL 4 MG/2ML IJ SOLN
INTRAMUSCULAR | Status: AC
  Administered 2024-12-15: 4 mg via INTRAVENOUS
  Filled 2024-12-15: qty 2

## 2024-12-15 MED ORDER — ACETAMINOPHEN 500 MG PO TABS
1000.0000 mg | ORAL_TABLET | ORAL | Status: AC
  Administered 2024-12-15: 1000 mg via ORAL
  Filled 2024-12-15: qty 2

## 2024-12-15 MED ORDER — LACTATED RINGERS IV SOLN: INTRAVENOUS | Status: DC

## 2024-12-15 MED ORDER — ROCURONIUM BROMIDE 100 MG/10ML IV SOLN
INTRAVENOUS | Status: DC | PRN
  Administered 2024-12-15: 30 mg via INTRAVENOUS

## 2024-12-15 MED ORDER — SUGAMMADEX SODIUM 200 MG/2ML IV SOLN
INTRAVENOUS | Status: DC | PRN
  Administered 2024-12-15: 272 mg via INTRAVENOUS

## 2024-12-15 NOTE — Anesthesia Procedure Notes (Signed)
 Procedure Name: Intubation Date/Time: 12/15/2024 10:33 AM  Performed by: Jerl Donald LABOR, CRNAPre-anesthesia Checklist: Patient identified, Emergency Drugs available, Suction available and Patient being monitored Patient Re-evaluated:Patient Re-evaluated prior to induction Oxygen Delivery Method: Circle System Utilized Preoxygenation: Pre-oxygenation with 100% oxygen Induction Type: IV induction, Cricoid Pressure applied and Rapid sequence Laryngoscope Size: Mac and 3 Grade View: Grade I Tube type: Oral Tube size: 7.0 mm Number of attempts: 1 Airway Equipment and Method: Stylet Placement Confirmation: ETT inserted through vocal cords under direct vision, positive ETCO2 and breath sounds checked- equal and bilateral Secured at: 20 cm Tube secured with: Tape Dental Injury: Teeth and Oropharynx as per pre-operative assessment

## 2024-12-15 NOTE — Anesthesia Procedure Notes (Signed)
 Anesthesia Regional Block: Pectoralis block   Pre-Anesthetic Checklist: , timeout performed,  Correct Patient, Correct Site, Correct Laterality,  Correct Procedure, Correct Position, site marked,  Risks and benefits discussed,  Surgical consent,  Pre-op evaluation,  At surgeon's request and post-op pain management  Laterality: Left  Prep: Dura Prep       Needles:  Injection technique: Single-shot  Needle Type: Echogenic Stimulator Needle     Needle Length: 5cm  Needle Gauge: 20     Additional Needles:   Procedures:,,,, ultrasound used (permanent image in chart),,    Narrative:  Start time: 12/15/2024 9:52 AM End time: 12/15/2024 9:56 AM Injection made incrementally with aspirations every 5 mL.  Performed by: Personally  Anesthesiologist: Dorethea Cordella SQUIBB, DO  Additional Notes: Patient identified. Risks/Benefits/Options discussed with patient including but not limited to bleeding, infection, nerve damage, failed block, incomplete pain control. Patient expressed understanding and wished to proceed. All questions were answered. Sterile technique was used throughout the entire procedure. Please see nursing notes for vital signs. Aspirated in 5cc intervals with injection for negative confirmation. Patient was given instructions on fall risk and not to get out of bed. All questions and concerns addressed with instructions to call with any issues or inadequate analgesia.

## 2024-12-15 NOTE — Transfer of Care (Signed)
 Immediate Anesthesia Transfer of Care Note  Patient: Amber [name]  Procedure(s) Performed: LUMPECTOMY WITH MAGNETIC MARKER LOCALIZATION (Left: Breast) BIOPSY, LYMPH NODE, SENTINEL, AXILLARY (Left)  Patient Location: PACU  Anesthesia Type:General  Level of Consciousness: awake, oriented, and drowsy  Airway & Oxygen Therapy: Patient connected to face mask oxygen  Post-op Assessment: Report given to RN, Post -op Vital signs reviewed and stable, and Patient moving all extremities  Post vital signs: Reviewed and stable  Last Vitals:  Vitals Value Taken Time  BP 122/59 12/15/24 11:41  Temp    Pulse 78 12/15/24 11:42  Resp 18 12/15/24   11:42  SpO2 97 % 12/15/24 11:42  Vitals shown include unfiled device data.  Last Pain:  Vitals:   12/15/24 0956  TempSrc:   PainSc: 0-No pain         Complications: No notable events documented.

## 2024-12-15 NOTE — Discharge Instructions (Signed)
 [facility] Office Phone Number [phone]  POST OP INSTRUCTIONS Take 400 mg of ibuprofen every 8 hours or 650 mg tylenol  every 6 hours for next 72 hours then as needed. Use ice several times daily also.  A prescription for pain medication may be given to you upon discharge.  Take your pain medication as prescribed, if needed.  If narcotic pain medicine is not needed, then you may take acetaminophen  (Tylenol ), naprosyn (Alleve) or ibuprofen (Advil) as needed. Take your usually prescribed medications unless otherwise directed If you need a refill on your pain medication, please contact your pharmacy.  They will contact our office to request authorization.  Prescriptions will not be filled after 5pm or on week-ends. You should eat very light the first 24 hours after surgery, such as soup, crackers, pudding, etc.  Resume your normal diet the day after surgery. Most patients will experience some swelling and bruising in the breast.  Ice packs and a good support bra will help.  Wear the breast binder provided or a sports bra for 72 hours day and night.  After that wear a sports bra during the day until you return to the office. Swelling and bruising can take several days to resolve.  It is common to experience some constipation if taking pain medication after surgery.  Increasing fluid intake and taking a stool softener will usually help or prevent this problem from occurring.  A mild laxative (Milk of Magnesia or Miralax) should be taken according to package directions if there are no bowel movements after 48 hours. I used skin glue on the incision, you may shower in 24 hours.  The glue will flake off over the next 2-3 weeks as will the steristrips.   Any sutures or staples will be removed at the office during your follow-up visit. ACTIVITIES:  You may resume regular daily activities (gradually increasing) beginning the next day.  Wearing a good support bra or sports bra minimizes pain and  swelling.  You may have sexual intercourse when it is comfortable. You may drive when you no longer are taking prescription pain medication, you can comfortably wear a seatbelt, and you can safely maneuver your car and apply brakes. RETURN TO WORK:  ______________________________________________________________________________________ Amber [name] should see your doctor in the office for a follow-up appointment approximately two to three weeks after your surgery.  Your doctor's nurse will typically make your follow-up appointment when she calls you with your pathology report.  Expect your pathology report 3-4 business days after your surgery.  You may call to check if you do not hear from us  after three days.  WHEN TO CALL DR Amber [name]: Fever over 101.0 Nausea and/or vomiting. Extreme swelling or bruising. Continued bleeding from incision. Increased pain, redness, or drainage from the incision.  The clinic staff is available to answer your questions during regular business hours.  Please don't hesitate to call and ask to speak to one of the nurses for clinical concerns.  If you have a medical emergency, go to the nearest emergency room or call 911.  A surgeon from [facility] is always on call at the hospital.  For further questions, please visit [email or web address]

## 2024-12-15 NOTE — Op Note (Signed)
 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  Left breast Magseed guided lumpectomy 2.  Injection of mag trace for sentinel lymph node identification 3.  Left deep axillary sentinel lymph node biopsy Surgeon: Dr. Adina Bury Anesthesia: General With a pectoral block Complications: None Drains: None Estimated blood loss: Minimal Specimens: 1.  Left breast tissue containing seed and clip marked with paint 2.  Additional medial, superior, posterior margins marked short superior, long lateral, double deep 3.  Left deep axillary sentinel lymph nodes with highest count of 925 Special count was correct completion Disposition recovery   Indications:89 yof with mass noted in left UOQ. She has family history in her mom at age 63. On MM this is 1.3 cm. US  is 1.3x0.9x1 cm mass. Axillary us  is negative. 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  She was seen in the multidisciplinary clinic.  We discussed proceeding with a lumpectomy.  Medical oncology and I were okay with omitting the sentinel node but radiation oncology prefers to have 1 for treatment.  Procedure: After informed consent was obtained she was taken to the operating room.  She first underwent a pectoral block.  She was given antibiotics.  SCDs were in place.  She was placed under general anesthesia without complication.  She did have some nausea associated with the fentanyl  preop.  She was then prepped and draped in a standard sterile surgical fashion.  A surgical timeout was then performed.  I identified the Magseed with the ultrasound prior to beginning.  I injected 1 cc of mag trace in the upper outer quadrant and massaged this.  I then proceeded to make a curvilinear incision overlying the Magseed's it was close to the skin.  I then removed the Magseed in the surrounding tissue with intent to get a clear margin.  Mammogram confirmed removal of the seed and  the clip.  On 3D imaging I thought it was close to several margins which I removed as above.  The posterior margin is the muscle.  I placed a couple clips in the cavity.  I obtained hemostasis.  I brought the breast tissue together with 2-0 Vicryl suture.  I then closed the skin with 3-0 Vicryl and 4-0 Monocryl.  Glue and Steri-Strips were eventually applied.  I then was able to identify signal in the axilla.  I made incision below the axillary hairline.  I used the probe to identify what appeared to be a couple of sentinel nodes.  I remove these and passed these off the table.  The remaining counts were 0.  There were no palpable or brown nodes present.  I obtained hemostasis.  I then closed the axillary fascia with 2-0 Vicryl.  The skin was closed with 3-0 Vicryl and 4-0 Monocryl.  Glue and Steri-Strips were applied.  She tolerated this well was extubated and transferred to recovery stable.

## 2024-12-15 NOTE — Interval H&P Note (Signed)
 History and Physical Interval Note:  12/15/2024 9:56 AM  Amber [name]  has presented today for surgery, with the diagnosis of LEFT BREAST CANCER.  The various methods of treatment have been discussed with the patient and family. After consideration of risks, benefits and other options for treatment, the patient has consented to  Procedures with comments: LUMPECTOMY WITH MAGNETIC MARKER LOCALIZATION (Left) BIOPSY, LYMPH NODE, SENTINEL, AXILLARY (Left) - LEFT BREAST SEED-GUIDED LUMPECTOMY LEFT AXILLARY SENTINEL NODE BIOPSY GEN w/PEC BLOCK as a surgical intervention.  The patient's history has been reviewed, patient examined, no change in status, stable for surgery.  I have reviewed the patient's chart and labs.  Questions were answered to the patient's satisfaction.     Donnice Bury

## 2024-12-16 ENCOUNTER — Encounter (HOSPITAL_COMMUNITY): Payer: Self-pay | Admitting: General Surgery

## 2024-12-17 NOTE — Anesthesia Postprocedure Evaluation (Signed)
"   Anesthesia Post Note  Patient: Amber [name]  Procedure(s) Performed: LUMPECTOMY WITH MAGNETIC MARKER LOCALIZATION (Left: Breast) BIOPSY, LYMPH NODE, SENTINEL, AXILLARY (Left)     Patient location during evaluation: PACU Anesthesia Type: Regional and General Level of consciousness: awake and alert Pain management: pain level controlled Vital Signs Assessment: post-procedure vital signs reviewed and stable Respiratory status: spontaneous breathing, nonlabored ventilation, respiratory function stable and patient connected to nasal cannula oxygen Cardiovascular status: blood pressure returned to baseline and stable Postop Assessment: no apparent nausea or vomiting Anesthetic complications: no   No notable events documented.  Last Vitals:  Vitals:   12/15/24 1315 12/15/24 1330  BP: (!) 121/54 (!) 114/51  Pulse: 74 66  Resp: 15 17  Temp:  36.5 C  SpO2: 96% 97%    Last Pain:  Vitals:   12/15/24 0956  TempSrc:   PainSc: 0-No pain                 Cordella SQUIBB Jamoni Hewes      "

## 2024-12-20 ENCOUNTER — Ambulatory Visit: Payer: Self-pay | Admitting: Genetic Counselor

## 2024-12-20 DIAGNOSIS — Z1379 Encounter for other screening for genetic and chromosomal anomalies: Secondary | ICD-10-CM

## 2024-12-20 NOTE — Telephone Encounter (Signed)
 I spoke to Ms. Macconnell's daughter, Beverley, to review results of genetic testing. Genetic testing completed with [facility]-Expanded +RNA panel. 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  We discussed risk to relatives, reproductive risks. Please see counseling note for further detail on this result. Portion of result included below.

## 2024-12-23 ENCOUNTER — Encounter: Payer: Self-pay | Admitting: *Deleted

## 2024-12-23 DIAGNOSIS — Z1379 Encounter for other screening for genetic and chromosomal anomalies: Secondary | ICD-10-CM | POA: Insufficient documentation

## 2024-12-23 LAB — SURGICAL PATHOLOGY

## 2024-12-23 NOTE — Progress Notes (Signed)
 GENETIC TEST RESULTS  Patient Name: Amber [name] Patient Age: 89 y.o. Encounter Date: 12/20/2024  Referring Provider: Mackey Chad, MD  Amber [name] was previously seen by genetic counseling in [facility] Cancer Genetics clinic on 11/30/24 due to a personal history of breast cancer, family history of cancer and concern regarding a hereditary predisposition to cancer in the family. Please refer to the prior Genetics clinic note for more information regarding Amber [name] medical and family histories and our assessment at the time.   Results were disclosed by telephone on 12/20/24.   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 Report date 12/07/24. Testing otherwise negative.    The CancerNext-Expanded gene panel offered by [facility] and includes sequencing, rearrangement, and RNA analysis for the following 77 genes: AIP, ALK, APC, ATM, AXIN2, BAP1, BARD1, BMPR1A, BRCA1, BRCA2, BRIP1, CDC73, CDH1, CDK4, CDKN1B, CDKN2A, CEBPA, CHEK2, CTNNA1, DDX41, DICER1, EGFR, EPCAM, ETV6, FH, FLCN, GATA2, GREM1, HOXB13, KIT, LZTR1, MAX, MBD4, MEN1, MET, MITF, MLH1, MSH2, MSH3, MSH6, MUTYH, NF1, NF2, NTHL1, PALB2, PDGFRA, PHOX2B, PMS2, POLD1, POLE, POT1, PRKAR1A,  PTCH1, PTEN, RAD51C, RAD51D, RB1, RET, RPS20, RUNX1, SDHA, SDHAF2, SDHB, SDHC, SDHD, SMAD4, SMARCA4, SMARCB1, SMARCE1, STK11, SUFU, TMEM127, TP53, TSC1, TSC2, VHL, WT1.      FAMILY HISTORY:  We obtained a detailed, 4-generation family history.  Significant diagnoses are listed below: Family History  Problem Relation Age of Onset   Breast cancer Mother 33   Breast cancer Sister 7 - 28   Bone cancer Brother    Kidney cancer Daughter 54    Amber [name] is unaware of previous family history of genetic testing for hereditary cancer risks. There is no reported Ashkenazi Jewish ancestry.         GENETIC TEST RESULTS: Genetic testing reported out on 12/07/24 through the [facility] CancerNext-Expanded +RNAinsight panel and did not identify any pathogenic variants known to be associated with breast cancer.   Amber [name] did test positive for a single pathogenic variant (harmful genetic change) in the FH gene. Specifically, this variant is [id number].   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  Testing was otherwise negative or normal. The CancerNext-Expanded gene panel offered by [facility] and includes sequencing, rearrangement, and RNA analysis for the following 77 genes: AIP, ALK, APC, ATM, AXIN2, BAP1, BARD1, BMPR1A, BRCA1, BRCA2, BRIP1, CDC73, CDH1, CDK4, CDKN1B, CDKN2A, CEBPA, CHEK2, CTNNA1, DDX41, DICER1, EGFR, EPCAM, ETV6, FH, FLCN, GATA2, GREM1, HOXB13, KIT, LZTR1, MAX, MBD4, MEN1, MET, MITF, MLH1, MSH2, MSH3, MSH6, MUTYH, NF1, NF2, NTHL1, PALB2, PDGFRA, PHOX2B, PMS2, POLD1, POLE, POT1, PRKAR1A, PTCH1, PTEN, RAD51C, RAD51D, RB1, RET, RPS20, RUNX1, SDHA, SDHAF2, SDHB, SDHC, SDHD, SMAD4, SMARCA4, SMARCB1, SMARCE1, STK11, SUFU, TMEM127, TP53, TSC1, TSC2,  VHL, WT1.    The test report has been scanned into EPIC  and is located under the Molecular Pathology section of the Results Review tab.  A portion of the result report is included below for reference.    FH Gene: The FH gene is associated with Hereditary Leiomyomatosis and Renal Cell Cancer (HLRCC). HLRCC is a rare, adult-onset tumor-predisposition syndrome that is characterized by cutaneous leiomyomas, uterine leiomyomas, and renal tumors. Additionally, the FH gene has preliminary evidence supporting a correlation with hereditary paraganglioma-pheochromocytoma. However, Amber [name] particular likely pathogenic variant in the FH gene [phone]) is not expected to confer risk for autosomal dominant HLRCC. 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 This result does not impact her screening recommendations at this time.   Of note, there are a few cases of individuals identified with this variant that have had overlap features with HLRCC, such as kidney cancer or uterine leiomyomas. However, the variant has also been reported in numerous individuals that do not have any features of HLRCC. Further information may be learned about this variant in the future.    Implications for Family Members: Pathogenic variants in the FH gene that are associated with HLRCC have autosomal dominant inheritance. This means that an individual with a pathogenic variant has a 50% chance of passing the condition on to their offspring. However, Amber [name] particular pathogenic variant in the FH gene is only known to be associated with being a carrier of autosomal recessive fumarate hydratase (FH) deficiency. FH deficiency is an inborn error of metabolism that is characterized by rapidly progressive neurologic impairment, including hypotonia, seizures, and cerebral atrophy. Most survive only for a few months after birth. For there to be a risk to offspring, both the patient and their partner would have to have a single pathogenic  variant in the FH gene; in such a case, the risk to offspring is 25%.   Amber [name] first degree relatives are at 50% risk for also having inherited the pathogenic variant in FH and being carriers of FH deficiency. Therefore, single site testing may be considered for all at risk relatives. They may contact our office at [phone] for more information or to schedule an appointment. Complimentary testing for the familial variant is available for 90 days. Family members who live outside of the area are encouraged to find a genetic counselor in their area by visiting: [email or web address].    Individuals in this family might be at some increased risk of developing cancer, over the general population risk, simply due to the family history of cancer.  We recommended women in this family have a yearly mammogram beginning at age 1, or 79 years younger than the earliest onset of cancer, an annual clinical breast exam, and perform monthly breast self-exams. Women in this family should also have a gynecological exam as recommended by their primary provider. All family members should be referred for colonoscopy starting at age 43, or 109 years younger than the earliest onset of cancer.  Other relatives may benefit from completing their own genetic testing, especially if they have been diagnosed with cancer.   CANCER SCREENING RECOMMENDATIONS: Ms. Kaney test did not identify an explanation for her personal history of cancer. Most cancers happen by chance and this test suggests that her personal and family history of cancer may fall into this category.    Possible reasons for Ms. Corea's non-diagnostic genetic test include:  1. There may be a gene mutation in one of  these genes that current testing methods cannot detect. The likelihood of this is low, but possible.   2. There could be another gene that has not yet been discovered, or that we have not yet tested, that is responsible for the  cancer diagnoses in the family.  3.  There may be no hereditary risk for cancer in the family. The cancers in Ms. Debellis and/or her family may be sporadic/familial or due to other genetic and environmental factors. 4. It is also possible there is a hereditary cause for the cancer in the family that Ms. Reddinger did not inherit.  Therefore, it is recommended she continue to follow the cancer management and screening guidelines provided by her oncology and primary healthcare provider. An individual's cancer risk and medical management are not determined by genetic test results alone. Overall cancer risk assessment incorporates additional factors, including personal medical history, family history, and any available genetic information that may result in a personalized plan for cancer prevention and surveillance  Given Ms. Ruffino's personal and family histories, we must interpret these results with some caution.  Families with features suggestive of hereditary risk for cancer tend to have multiple family members with cancer, diagnoses in multiple generations and diagnoses before the age of 3. Ms. Yazdani family exhibits some of these features. Thus, this result may simply reflect our current inability to detect all mutations within these genes or there may be a different gene that has not yet been discovered or tested.    FOLLOW-UP: Lastly, we discussed with Ms. Mckain that cancer genetics is a rapidly advancing field and it is possible that new genetic tests will be appropriate for her and/or her family members in the future. We encouraged her to remain in contact with cancer genetics on an annual basis so we can update her personal and family histories and let her know of advances in cancer genetics that may benefit this family.   Our contact number was provided. Ms. Mccullars questions were answered to her satisfaction, and she knows she is welcome to call us  at anytime with additional questions or concerns.    Burnard Ogren, MS, [facility] Licensed, [email or web address] [phone]

## 2024-12-27 ENCOUNTER — Ambulatory Visit: Admitting: Sports Medicine

## 2025-01-10 ENCOUNTER — Inpatient Hospital Stay: Admitting: Hematology and Oncology

## 2025-01-11 ENCOUNTER — Ambulatory Visit

## 2025-01-11 ENCOUNTER — Ambulatory Visit: Admitting: Radiation Oncology

## 2025-01-13 ENCOUNTER — Ambulatory Visit: Admitting: Radiation Oncology
